# Patient Record
Sex: Female | Born: 1940 | Race: White | Hispanic: No | Smoking: Never smoker
Health system: Southern US, Community
[De-identification: ages and names within clinical notes are randomized; demographics above are authoritative.]

## PROBLEM LIST (undated history)

## (undated) DIAGNOSIS — I10 Essential (primary) hypertension: Secondary | ICD-10-CM

## (undated) DIAGNOSIS — E559 Vitamin D deficiency, unspecified: Secondary | ICD-10-CM

## (undated) DIAGNOSIS — I69319 Unspecified symptoms and signs involving cognitive functions following cerebral infarction: Secondary | ICD-10-CM

## (undated) DIAGNOSIS — M1990 Unspecified osteoarthritis, unspecified site: Secondary | ICD-10-CM

## (undated) DIAGNOSIS — Z86711 Personal history of pulmonary embolism: Secondary | ICD-10-CM

## (undated) DIAGNOSIS — M8580 Other specified disorders of bone density and structure, unspecified site: Secondary | ICD-10-CM

## (undated) DIAGNOSIS — F0390 Unspecified dementia without behavioral disturbance: Secondary | ICD-10-CM

## (undated) DIAGNOSIS — E785 Hyperlipidemia, unspecified: Secondary | ICD-10-CM

## (undated) DIAGNOSIS — Z1231 Encounter for screening mammogram for malignant neoplasm of breast: Secondary | ICD-10-CM

## (undated) HISTORY — DX: Unspecified osteoarthritis, unspecified site: M19.90

## (undated) HISTORY — DX: Personal history of pulmonary embolism: Z86.711

## (undated) HISTORY — DX: Hyperlipidemia, unspecified: E78.5

## (undated) HISTORY — PX: TOTAL HIP ARTHROPLASTY: SHX124

## (undated) HISTORY — DX: Unspecified symptoms and signs involving cognitive functions following cerebral infarction: I69.319

## (undated) HISTORY — PX: APPENDECTOMY: SHX54

## (undated) HISTORY — DX: Other specified disorders of bone density and structure, unspecified site: M85.80

## (undated) HISTORY — PX: DILATION AND CURETTAGE OF UTERUS: SHX78

## (undated) HISTORY — DX: Vitamin D deficiency, unspecified: E55.9

## (undated) HISTORY — DX: Unspecified dementia, unspecified severity, without behavioral disturbance, psychotic disturbance, mood disturbance, and anxiety: F03.90

## (undated) HISTORY — PX: TUBAL LIGATION: SHX77

## (undated) HISTORY — PX: FEMORAL HERNIA REPAIR: SUR1179

## (undated) HISTORY — DX: Essential (primary) hypertension: I10

## (undated) HISTORY — PX: LUMBAR FUSION: SHX111

---

## 2007-11-05 LAB — LIPID PANEL
CHOL/HDL Ratio: 3.2 (ref 0–5.0)
Cholesterol, total: 181 MG/DL (ref 0–200)
HDL Cholesterol: 57 MG/DL (ref 40–60)
LDL, calculated: 105 MG/DL — ABNORMAL HIGH (ref 0–100)
Triglyceride: 95 MG/DL (ref 0–150)
VLDL, calculated: 19 MG/DL

## 2007-11-05 LAB — ALT: ALT (SGPT): 27 U/L — ABNORMAL LOW (ref 30–65)

## 2008-06-10 LAB — CBC WITH AUTOMATED DIFF
ABS. EOSINOPHILS: 0.1 10*3/uL (ref 0.0–0.4)
ABS. LYMPHOCYTES: 1.1 10*3/uL (ref 0.8–3.5)
ABS. MONOCYTES: 0.4 10*3/uL (ref 0–1.0)
ABS. NEUTROPHILS: 3.4 10*3/uL (ref 1.8–8.0)
BASOPHILS: 0 % (ref 0–3)
EOSINOPHILS: 2 % (ref 0–5)
HCT: 37.3 % (ref 36.0–46.0)
HGB: 13.4 g/dL (ref 12.0–16.0)
LYMPHOCYTES: 22 % (ref 20–51)
MCH: 33 PG (ref 25.0–35.0)
MCHC: 35.9 g/dL (ref 31.0–37.0)
MCV: 92 FL (ref 78.0–102.0)
MONOCYTES: 8 % (ref 2–9)
MPV: 8.3 FL (ref 7.4–10.4)
NEUTROPHILS: 68 % (ref 42–75)
PLATELET: 192 10*3/uL (ref 130–400)
RBC: 4.05 M/uL — ABNORMAL LOW (ref 4.10–5.10)
RDW: 13.4 % (ref 11.5–14.5)
WBC: 5 10*3/uL (ref 4.5–13.0)

## 2008-06-10 LAB — LIPID PANEL
CHOL/HDL Ratio: 3.3 (ref 0–5.0)
Cholesterol, total: 178 MG/DL (ref 0–200)
HDL Cholesterol: 54 MG/DL (ref 40–60)
LDL, calculated: 104 MG/DL — ABNORMAL HIGH (ref 0–100)
Triglyceride: 100 MG/DL (ref 0–150)
VLDL, calculated: 20 MG/DL

## 2008-06-10 LAB — METABOLIC PANEL, COMPREHENSIVE
A-G Ratio: 1.2 (ref 0.8–1.7)
ALT (SGPT): 31 U/L (ref 30–65)
AST (SGOT): 17 U/L (ref 15–37)
Albumin: 3.6 g/dL (ref 3.4–5.0)
Alk. phosphatase: 102 U/L (ref 50–136)
Anion gap: 7 mmol/L (ref 5–15)
BUN/Creatinine ratio: 17 (ref 12–20)
BUN: 15 MG/DL (ref 7–18)
Bilirubin, total: 0.4 MG/DL (ref 0.1–0.9)
CO2: 33 MMOL/L — ABNORMAL HIGH (ref 21–32)
Calcium: 9.3 MG/DL (ref 8.4–10.4)
Chloride: 106 MMOL/L (ref 100–108)
Creatinine: 0.9 mg/dL (ref 0.6–1.3)
GFR est AA: >60 mL/min/{1.73_m2} (ref >60)
GFR est non-AA: >60 mL/min/{1.73_m2} (ref >60)
Globulin: 3.1 g/dL (ref 2.0–4.0)
Glucose: 94 MG/DL (ref 74–99)
Potassium: 4.6 MMOL/L (ref 3.5–5.5)
Protein, total: 6.7 g/dL (ref 6.4–8.2)
Sodium: 146 MMOL/L — ABNORMAL HIGH (ref 136–145)

## 2008-06-10 LAB — TSH 3RD GENERATION: TSH: 1.66 u[IU]/mL (ref 0.51–6.27)

## 2008-06-12 LAB — VITAMIN D, 25 HYDROXY: Vitamin D 25-Hydroxy: 36 ng/mL (ref 30–80)

## 2013-08-24 LAB — MAGNESIUM: Magnesium: 2.2 mg/dL (ref 1.8–2.4)

## 2013-08-24 LAB — METABOLIC PANEL, BASIC
Anion gap: 11 mmol/L (ref 3.0–18)
BUN/Creatinine ratio: 18 (ref 12–20)
BUN: 18 MG/DL (ref 7.0–18)
CO2: 25 mmol/L (ref 21–32)
Calcium: 9.1 MG/DL (ref 8.5–10.1)
Chloride: 101 mmol/L (ref 100–108)
Creatinine: 0.99 MG/DL (ref 0.6–1.3)
GFR est AA: >60 mL/min/{1.73_m2} (ref >60)
GFR est non-AA: 58 mL/min/{1.73_m2} — ABNORMAL LOW (ref >60)
Glucose: 88 mg/dL (ref 74–99)
Potassium: 3.3 mmol/L — ABNORMAL LOW (ref 3.5–5.5)
Sodium: 137 mmol/L (ref 136–145)

## 2013-08-24 LAB — CBC WITH AUTOMATED DIFF
ABS. BASOPHILS: 0 10*3/uL (ref 0.0–0.1)
ABS. EOSINOPHILS: 0.1 10*3/uL (ref 0.0–0.4)
ABS. LYMPHOCYTES: 0.5 10*3/uL — ABNORMAL LOW (ref 0.9–3.6)
ABS. MONOCYTES: 0.5 10*3/uL (ref 0.05–1.2)
ABS. NEUTROPHILS: 9.2 10*3/uL — ABNORMAL HIGH (ref 1.8–8.0)
BASOPHILS: 0 % (ref 0–2)
EOSINOPHILS: 1 % (ref 0–5)
HCT: 40.4 % (ref 35.0–45.0)
HGB: 13.9 g/dL (ref 12.0–16.0)
LYMPHOCYTES: 5 % — ABNORMAL LOW (ref 21–52)
MCH: 31.8 PG (ref 24.0–34.0)
MCHC: 34.4 g/dL (ref 31.0–37.0)
MCV: 92.4 FL (ref 74.0–97.0)
MONOCYTES: 5 % (ref 3–10)
MPV: 10 FL (ref 9.2–11.8)
NEUTROPHILS: 89 % — ABNORMAL HIGH (ref 40–73)
PLATELET: 230 10*3/uL (ref 135–420)
RBC: 4.37 M/uL (ref 4.20–5.30)
RDW: 13.2 % (ref 11.6–14.5)
WBC: 10.3 10*3/uL (ref 4.6–13.2)

## 2013-08-24 LAB — URINALYSIS W/ RFLX MICROSCOPIC
Blood: NEGATIVE
Glucose: NEGATIVE mg/dL
Ketone: >80 mg/dL — AB
Leukocyte Esterase: NEGATIVE
Nitrites: NEGATIVE
Specific gravity: 1.02 (ref 1.003–1.030)
Urobilinogen: 0.2 EU/dL (ref 0.2–1.0)
pH (UA): 6 (ref 5.0–8.0)

## 2013-08-24 LAB — CARDIAC PANEL,(CK, CKMB & TROPONIN)
CK - MB: <0.5 ng/ml — ABNORMAL LOW (ref 0.5–3.6)
CK: 34 U/L (ref 26–192)
Troponin-I, QT: <0.02 NG/ML (ref 0.0–0.045)

## 2013-08-24 LAB — URINE MICROSCOPIC ONLY

## 2013-08-24 LAB — HEPATIC FUNCTION PANEL
A-G Ratio: 0.8 (ref 0.8–1.7)
ALT (SGPT): 21 U/L (ref 13–56)
AST (SGOT): 13 U/L — ABNORMAL LOW (ref 15–37)
Albumin: 3.1 g/dL — ABNORMAL LOW (ref 3.4–5.0)
Alk. phosphatase: 100 U/L (ref 45–117)
Bilirubin, direct: 0.3 MG/DL — ABNORMAL HIGH (ref 0.0–0.2)
Bilirubin, total: 1 MG/DL (ref 0.2–1.0)
Globulin: 3.9 g/dL (ref 2.0–4.0)
Protein, total: 7 g/dL (ref 6.4–8.2)

## 2013-08-24 LAB — PROTHROMBIN TIME + INR
INR: 1 (ref 0.8–1.2)
Prothrombin time: 13.1 s (ref 11.5–15.2)

## 2013-08-24 LAB — PTT: aPTT: 37.9 s — ABNORMAL HIGH (ref 24.6–37.7)

## 2013-08-24 LAB — LIPASE: Lipase: 160 U/L (ref 73–393)

## 2013-08-24 MED ORDER — IOPAMIDOL 61 % IV SOLN
300 mg iodine /mL (61 %) | Freq: Once | INTRAVENOUS | Status: AC
Start: 2013-08-24 — End: 2013-08-24
  Administered 2013-08-24: 20:00:00 via INTRAVENOUS

## 2013-08-24 MED ORDER — CIPROFLOXACIN 500 MG TAB
500 mg | ORAL_TABLET | Freq: Two times a day (BID) | ORAL | Status: AC
Start: 2013-08-24 — End: 2013-09-03

## 2013-08-24 MED ORDER — ONDANSETRON (PF) 4 MG/2 ML INJECTION
4 mg/2 mL | INTRAMUSCULAR | Status: AC
Start: 2013-08-24 — End: 2013-08-24
  Administered 2013-08-24: 19:00:00 via INTRAVENOUS

## 2013-08-24 MED ORDER — METRONIDAZOLE 500 MG TAB
500 mg | ORAL_TABLET | Freq: Three times a day (TID) | ORAL | Status: AC
Start: 2013-08-24 — End: 2013-09-03

## 2013-08-24 MED ORDER — OXYCODONE-ACETAMINOPHEN 5 MG-325 MG TAB
5-325 mg | ORAL_TABLET | ORAL | Status: DC
Start: 2013-08-24 — End: 2013-09-06

## 2013-08-24 MED ORDER — ONDANSETRON 4 MG TAB, RAPID DISSOLVE
4 mg | ORAL_TABLET | ORAL | Status: DC
Start: 2013-08-24 — End: 2013-09-06

## 2013-08-24 MED ORDER — POTASSIUM CHLORIDE SR 20 MEQ TAB, PARTICLES/CRYSTALS
20 mEq | ORAL | Status: DC
Start: 2013-08-24 — End: 2013-08-24

## 2013-08-24 MED ADMIN — sodium chloride 0.9 % bolus infusion 1,000 mL: INTRAVENOUS | @ 19:00:00 | NDC 00409798309

## 2013-08-24 MED FILL — POTASSIUM CHLORIDE SR 20 MEQ TAB, PARTICLES/CRYSTALS: 20 mEq | ORAL | Qty: 2

## 2013-08-24 MED FILL — ISOVUE-300  61 % INTRAVENOUS SOLUTION: 300 mg iodine /mL (61 %) | INTRAVENOUS | Qty: 100

## 2013-08-24 MED FILL — ONDANSETRON HCL 4 MG/2 ML INTRAVENOUS SYRINGE: 4 mg/2 mL | INTRAVENOUS | Qty: 2

## 2013-08-24 MED FILL — SODIUM CHLORIDE 0.9 % IV: INTRAVENOUS | Qty: 1000

## 2013-08-24 NOTE — ED Notes (Signed)
Pt given discharge instructions and prescriptions. Armband removed. Pt left c family via private vehicle.

## 2013-08-24 NOTE — ED Provider Notes (Signed)
HPI Comments: Judith Jenkins is a 73 y.o. Female with Hx of HTN, presents to ED with c/o N/V and abdominal pain x 4 days.  She says pain diffuse lower abdomen, comes and goes, cramp like.  Diarrhea liquid, has decreased d/t not eating much, and pt says there is bright red blood in stool.  She has never had this before, has Hx of hemorrhoids, no anal pain.  She says sometimes when he vomits she notices some bright red blood in vomit.  She says she has forceful vomiting.  She has had normal colonoscopies last unknown but says she goes every 10 years.  She denies any fever, chills, Cp, SOb, urinary symptoms, leg swelling.  She has had her appendix removed.  No other complaints.       Patient is a 73 y.o. female presenting with nausea and vomiting.   Nausea     Vomiting          No past medical history on file.     No past surgical history on file.      No family history on file.     History     Social History   ??? Marital Status: SINGLE     Spouse Name: N/A     Number of Children: N/A   ??? Years of Education: N/A     Occupational History   ??? Not on file.     Social History Main Topics   ??? Smoking status: Not on file   ??? Smokeless tobacco: Not on file   ??? Alcohol Use: Not on file   ??? Drug Use: Not on file   ??? Sexual Activity: Not on file     Other Topics Concern   ??? Not on file     Social History Narrative   ??? No narrative on file                  ALLERGIES: Sulfa (sulfonamide antibiotics)      Review of Systems   Gastrointestinal: Positive for nausea and vomiting.   Constitutional:  Denies  fever, chills.   Head:  Denies injury.   Face:  Denies injury or pain.   ENMT:  Denies sore throat, trouble swallowing, runny nose, congestion   Neck:  Denies injury or pain.   Chest:  Denies injury.   Cardiac:  Denies chest pain or palpitations.   Respiratory:  Denies cough, wheezing, difficulty breathing, shortness of breath.   GI/ABD:  + pain, nausea, vomiting, diarrhea.   GU:  Denies injury, pain, dysuria, urgency, frequency    Back:  Denies injury or pain.   Extremity/MS:  Denies injury or pain.    Neuro:  Denies headache, LOC, dizziness, neurologic symptoms/deficits/paresthesias/MW  Skin: Denies injury, rash, itching or skin changes.      Filed Vitals:    08/24/13 1420   BP: 167/88   Pulse: 90   Temp: 98.2 ??F (36.8 ??C)   Resp: 16   Height: $Remove'5\' 1"'twhwbPq$  (1.549 m)   Weight: 58.968 kg (130 lb)   SpO2: 97%            Physical Exam   CONSTITUTIONAL: Alert, in no apparent distress; well-developed; well-nourished.   HEAD:  Normocephalic, atraumatic.   EYES:   EOM's intact, conjunctiva normal   ENTM: Nose:  No rhinorrhea.  External ears and ear canals normal BL.  Mucous membranes moist, uvula midline, able to handle oral secretions without difficulty, posterior pharynx normal.  Neck:  supple   RESP: Chest clear, equal breath sounds.   CV: S1 and S2 WNL; No murmurs, gallops or rubs.   GI: Abdomen soft with diffuse TTP to lower abdomen.   No masses or organomegaly.   Rectal:  Hemorrhoid noted, not thrombosed, no apparent active bleeding, hemoccult +   UPPER EXT:  Normal inspection.   LOWER EXT: No edema.   NEURO: gait normal, tone normal, sensation intact   SKIN: No rashes; Normal for age and stage.   PSYCH:  Alert and oriented, normal affect.    MDM  Number of Diagnoses or Management Options  Diarrhea:   Diverticulitis of large intestine without perforation or abscess with bleeding:   Hematemesis:   Hematochezia:   Nausea with vomiting:   Diagnosis management comments: DDX: appendicitis, diverticulitis, renal colic, UTI, cholecystitis, biliary colic, constipation, IBS, GI bleed, PUD, perforated bowel, bowel obstruction, bowel ischemia, pancreatitis, hepatitis, inguinal hernia, incarcerated hernia, less likely cardiac etiology    Plan:  Labs reassuring.  EKG NSR with no specific ST abnormality.  H & H stable.    CT IMPRESSION:  Colonic diverticulosis with greatest extent in the sigmoid colon where there is   mesenteric stranding, wall thickening and collection of free fluid suggestive of  acute diverticulitis.  Cholelithiasis without evidence for acute cholecystitis.  Benign right renal cyst and other hypodensities in the kidneys which are too  small to accurately characterize, probably also benign renal cysts.  Nonobstructive right nephrolithiasis.  Atherosclerotic disease.    Pt able to tolerate PO fluids, vitals stable, afebrile, no WBC.  Plan to DC with cipro/flagyl, zofran, few pain meds.  Rest, diet precautions explained.  Pt has Hx of diverticulitis.    5:11 PM  I HAVE REVIEWED THIS CASE WITH THE ED ATTENDING, DR. Veneta Penton , INCLUDING THE HISTORY, PHYSICAL EXAM FINDINGS AND AVAILABLE DIAGNOSTIC RESULTS.  THE ATTENDING MD AGREES WITH PLAN OF CARE AT THIS TIME.          Procedures    -------------------------------------------------------------------------------------------------------------------  Orders:  Orders Placed This Encounter   ??? CT ABD PELV W CONT     IV only     Standing Status: Standing      Number of Occurrences: 1      Standing Expiration Date:      Order Specific Question:  Transport     Answer:  Biomedical scientist [[id number]]     Order Specific Question:  Reason for Exam     Answer:  ab pain, N/V, blood in vomit and stool     Order Specific Question:  Is Patient Allergic to Contrast Dye?     Answer:  No     Order Specific Question:  Type of Contrast     Answer:  IV     Order Specific Question:  Does patient have history of Renal Disease?     Answer:  No     Order Specific Question:  Oral Contrast     Answer:  Per Radiologist Protocol   ??? CBC WITH AUTOMATED DIFF     Standing Status: Standing      Number of Occurrences: 1      Standing Expiration Date:    ??? METABOLIC PANEL, BASIC     Standing Status: Standing      Number of Occurrences: 1      Standing Expiration Date:    ??? MAGNESIUM     Standing Status: Standing      Number of Occurrences: 1  Standing Expiration Date:    ??? CARDIAC PANEL,(CK, CKMB & TROPONIN)      Standing Status: Standing      Number of Occurrences: 1      Standing Expiration Date:    ??? HEPATIC FUNCTION PANEL     Standing Status: Standing      Number of Occurrences: 1      Standing Expiration Date:    ??? LIPASE     Standing Status: Standing      Number of Occurrences: 1      Standing Expiration Date:    ??? PROTHROMBIN TIME + INR     Standing Status: Standing      Number of Occurrences: 1      Standing Expiration Date:    ??? PTT     Standing Status: Standing      Number of Occurrences: 1      Standing Expiration Date:    ??? URINALYSIS W/ RFLX MICROSCOPIC     Standing Status: Standing      Number of Occurrences: 1      Standing Expiration Date:    ??? URINE MICROSCOPIC ONLY     Standing Status: Standing      Number of Occurrences: 1      Standing Expiration Date:    ??? POC FECAL OCCULT BLOOD     Standing Status: Standing      Number of Occurrences: 1      Standing Expiration Date:    ??? EKG, 12 LEAD, INITIAL     Standing Status: Standing      Number of Occurrences: 1      Standing Expiration Date:      Order Specific Question:  Reason for Exam:     Answer:  N/V   ??? INSERT PERIPHERAL IV ONE TIME STAT     Standing Status: Standing      Number of Occurrences: 1      Standing Expiration Date:    ??? sodium chloride 0.9 % bolus infusion 1,000 mL     Sig:    ??? ondansetron (ZOFRAN) injection 4 mg     Sig:    ??? iopamidol (ISOVUE 300) 61 % contrast injection 50-100 mL     Sig:    ??? potassium chloride (K-DUR, KLOR-CON) SR tablet 40 mEq     Sig:    ??? ciprofloxacin HCl (CIPRO) 500 mg tablet     Sig: Take 1 Tab by mouth two (2) times a day for 10 days.     Dispense:  20 Tab     Refill:  0   ??? metroNIDAZOLE (FLAGYL) 500 mg tablet     Sig: Take 1 Tab by mouth three (3) times daily for 10 days.     Dispense:  30 Tab     Refill:  0   ??? ondansetron (ZOFRAN ODT) 4 mg disintegrating tablet     Sig: Take 1-2 tablets every 6-8 hours as needed for nausea and vomiting.     Dispense:  10 Tab     Refill:  0    ??? oxyCODONE-acetaminophen (PERCOCET) 5-325 mg per tablet     Sig: Take 1 tablet every 4-6 hours as needed for pain control.  If you were instructed to try over the counter ibuprofen or tylenol, only take the percocet for pain not controlled with the over the counter medication.     Dispense:  9 Tab     Refill:  0  EKG Interpretation:      Lab Results:   Recent Results (from the past 12 hour(s))   CBC WITH AUTOMATED DIFF    Collection Time: 08/24/13  2:55 PM   Result Value Ref Range    WBC 10.3 4.6 - 13.2 K/uL    RBC 4.37 4.20 - 5.30 M/uL    HGB 13.9 12.0 - 16.0 g/dL    HCT 40.4 35.0 - 45.0 %    MCV 92.4 74.0 - 97.0 FL    MCH 31.8 24.0 - 34.0 PG    MCHC 34.4 31.0 - 37.0 g/dL    RDW 13.2 11.6 - 14.5 %    PLATELET 230 135 - 420 K/uL    MPV 10.0 9.2 - 11.8 FL    NEUTROPHILS 89 (H) 40 - 73 %    LYMPHOCYTES 5 (L) 21 - 52 %    MONOCYTES 5 3 - 10 %    EOSINOPHILS 1 0 - 5 %    BASOPHILS 0 0 - 2 %    ABS. NEUTROPHILS 9.2 (H) 1.8 - 8.0 K/UL    ABS. LYMPHOCYTES 0.5 (L) 0.9 - 3.6 K/UL    ABS. MONOCYTES 0.5 0.05 - 1.2 K/UL    ABS. EOSINOPHILS 0.1 0.0 - 0.4 K/UL    ABS. BASOPHILS 0.0 0.0 - 0.1 K/UL    DF AUTOMATED     METABOLIC PANEL, BASIC    Collection Time: 08/24/13  2:55 PM   Result Value Ref Range    Sodium 137 136 - 145 mmol/L    Potassium 3.3 (L) 3.5 - 5.5 mmol/L    Chloride 101 100 - 108 mmol/L    CO2 25 21 - 32 mmol/L    Anion gap 11 3.0 - 18 mmol/L    Glucose 88 74 - 99 mg/dL    BUN 18 7.0 - 18 MG/DL    Creatinine 0.99 0.6 - 1.3 MG/DL    BUN/Creatinine ratio 18 12 - 20      GFR est AA >60 >60 ml/min/1.30m    GFR est non-AA 58 (L) >60 ml/min/1.728m   Calcium 9.1 8.5 - 10.1 MG/DL   MAGNESIUM    Collection Time: 08/24/13  2:55 PM   Result Value Ref Range    Magnesium 2.2 1.8 - 2.4 mg/dL   CARDIAC PANEL,(CK, CKMB & TROPONIN)    Collection Time: 08/24/13  2:55 PM   Result Value Ref Range    CK 34 26 - 192 U/L    CK - MB <0.5 (L) 0.5 - 3.6 ng/ml    CK-MB Index CANNOT BE CALCULATED 0.0 - 4.0 %     Troponin-I, Qt. <0.02 0.0 - 0.045 NG/ML   HEPATIC FUNCTION PANEL    Collection Time: 08/24/13  2:55 PM   Result Value Ref Range    Protein, total 7.0 6.4 - 8.2 g/dL    Albumin 3.1 (L) 3.4 - 5.0 g/dL    Globulin 3.9 2.0 - 4.0 g/dL    A-G Ratio 0.8 0.8 - 1.7      Bilirubin, total 1.0 0.2 - 1.0 MG/DL    Bilirubin, direct 0.3 (H) 0.0 - 0.2 MG/DL    Alk. phosphatase 100 45 - 117 U/L    AST 13 (L) 15 - 37 U/L    ALT 21 13 - 56 U/L   LIPASE    Collection Time: 08/24/13  2:55 PM   Result Value Ref Range    Lipase 160 73 - 393 U/L  PROTHROMBIN TIME + INR    Collection Time: 08/24/13  2:55 PM   Result Value Ref Range    Prothrombin time 13.1 11.5 - 15.2 sec    INR 1.0 0.8 - 1.2     PTT    Collection Time: 08/24/13  2:55 PM   Result Value Ref Range    aPTT 37.9 (H) 24.6 - 37.7 SEC   URINALYSIS W/ RFLX MICROSCOPIC    Collection Time: 08/24/13  4:05 PM   Result Value Ref Range    Color YELLOW      Appearance CLEAR      Specific gravity 1.020 1.003 - 1.030      pH (UA) 6.0 5.0 - 8.0      Protein TRACE (A) NEG mg/dL    Glucose NEGATIVE  NEG mg/dL    Ketone >80 (A) NEG mg/dL    Bilirubin MODERATE (A) NEG      Blood NEGATIVE  NEG      Urobilinogen 0.2 0.2 - 1.0 EU/dL    Nitrites NEGATIVE  NEG      Leukocyte Esterase NEGATIVE  NEG     URINE MICROSCOPIC ONLY    Collection Time: 08/24/13  4:05 PM   Result Value Ref Range    WBC NONE 0 - 4 /hpf    RBC NONE 0 - 5 /hpf    Epithelial cells 2+ 0 - 5 /lpf    Bacteria FEW (A) NEG /hpf       Radiology Results:  CT ABD PELV W CONT   Final Result          Consultations:      Progress Notes:  5:14 PM:  Daphane Shepherd, PA answered the patient's questions regarding treatment.      -------------------------------------------------------------------------------------------------------------------    PROVIDER ATTESTATION STATEMENT      Disposition:  Diagnosis:   1. Nausea with vomiting    2. Diarrhea    3. Diverticulitis of large intestine without perforation or abscess with bleeding     4. Hematemesis    5. Hematochezia          Disposition: Home    Follow-up Information     Follow up With Details Comments Contact Info    [facility] EMERGENCY DEPT  As needed, If symptoms worsen [place]    your family doctor  Schedule an appointment as soon as possible for a visit in 2 days      Ron Agee, MD Schedule an appointment as soon as possible for a visit in 3 days  [address]  [place] [address]  [phone]            Current Discharge Medication List      START taking these medications    Details   ciprofloxacin HCl (CIPRO) 500 mg tablet Take 1 Tab by mouth two (2) times a day for 10 days.  Qty: 20 Tab, Refills: 0      metroNIDAZOLE (FLAGYL) 500 mg tablet Take 1 Tab by mouth three (3) times daily for 10 days.  Qty: 30 Tab, Refills: 0      ondansetron (ZOFRAN ODT) 4 mg disintegrating tablet Take 1-2 tablets every 6-8 hours as needed for nausea and vomiting.  Qty: 10 Tab, Refills: 0      oxyCODONE-acetaminophen (PERCOCET) 5-325 mg per tablet Take 1 tablet every 4-6 hours as needed for pain control.  If you were instructed to try over the counter  ibuprofen or tylenol, only take the percocet for pain not controlled with the over the counter medication.  Qty: 9 Tab, Refills: 0

## 2013-08-24 NOTE — ED Notes (Signed)
Pt states nausea and vomiting x 4 days. "feels like i have the flu"

## 2013-08-26 LAB — EKG, 12 LEAD, INITIAL
Atrial Rate: 73 {beats}/min
Calculated P Axis: 78 degrees
Calculated R Axis: 0 degrees
Calculated T Axis: 11 degrees
Diagnosis: NORMAL
P-R Interval: 162 ms
Q-T Interval: 376 ms
QRS Duration: 84 ms
QTC Calculation (Bezet): 414 ms
Ventricular Rate: 73 {beats}/min

## 2013-09-04 ENCOUNTER — Inpatient Hospital Stay
Admit: 2013-09-04 | Discharge: 2013-09-06 | Disposition: A | Discharge status: Home / Self Care | Payer: MEDICARE | Source: Other Acute Inpatient Hospital | Attending: Internal Medicine | Admitting: Internal Medicine

## 2013-09-04 DIAGNOSIS — N179 Acute kidney failure, unspecified: Secondary | ICD-10-CM

## 2013-09-04 LAB — METABOLIC PANEL, COMPREHENSIVE
A-G Ratio: 0.9 (ref 0.8–1.7)
ALT (SGPT): 19 U/L (ref 13–56)
AST (SGOT): 15 U/L (ref 15–37)
Albumin: 3.5 g/dL (ref 3.4–5.0)
Alk. phosphatase: 79 U/L (ref 45–117)
Anion gap: 13 mmol/L (ref 3.0–18)
BUN/Creatinine ratio: 10 — ABNORMAL LOW (ref 12–20)
BUN: 27 MG/DL — ABNORMAL HIGH (ref 7.0–18)
Bilirubin, total: 0.6 MG/DL (ref 0.2–1.0)
CO2: 26 mmol/L (ref 21–32)
Calcium: 9.6 MG/DL (ref 8.5–10.1)
Chloride: 100 mmol/L (ref 100–108)
Creatinine: 2.59 MG/DL — ABNORMAL HIGH (ref 0.6–1.3)
GFR est AA: 23 mL/min/{1.73_m2} — ABNORMAL LOW (ref >60)
GFR est non-AA: 19 mL/min/{1.73_m2} — ABNORMAL LOW (ref >60)
Globulin: 3.8 g/dL (ref 2.0–4.0)
Glucose: 110 mg/dL — ABNORMAL HIGH (ref 74–99)
Potassium: 2.7 mmol/L — CL (ref 3.5–5.5)
Protein, total: 7.3 g/dL (ref 6.4–8.2)
Sodium: 139 mmol/L (ref 136–145)

## 2013-09-04 LAB — LACTIC ACID
Lactic acid: 2.2 MMOL/L — ABNORMAL HIGH (ref 0.4–2.0)
Lactic acid: 1.1 MMOL/L (ref 0.4–2.0)

## 2013-09-04 LAB — CBC WITH AUTOMATED DIFF
ABS. BASOPHILS: 0.1 10*3/uL (ref 0.0–0.1)
ABS. EOSINOPHILS: 0.1 10*3/uL (ref 0.0–0.4)
ABS. LYMPHOCYTES: 1.7 10*3/uL (ref 0.9–3.6)
ABS. MONOCYTES: 0.9 10*3/uL (ref 0.05–1.2)
ABS. NEUTROPHILS: 9.4 10*3/uL — ABNORMAL HIGH (ref 1.8–8.0)
BASOPHILS: 0 % (ref 0–2)
EOSINOPHILS: 1 % (ref 0–5)
HCT: 40.7 % (ref 35.0–45.0)
HGB: 14.3 g/dL (ref 12.0–16.0)
LYMPHOCYTES: 14 % — ABNORMAL LOW (ref 21–52)
MCH: 31.6 PG (ref 24.0–34.0)
MCHC: 35.1 g/dL (ref 31.0–37.0)
MCV: 89.8 FL (ref 74.0–97.0)
MONOCYTES: 8 % (ref 3–10)
MPV: 10.7 FL (ref 9.2–11.8)
NEUTROPHILS: 77 % — ABNORMAL HIGH (ref 40–73)
PLATELET: 305 10*3/uL (ref 135–420)
RBC: 4.53 M/uL (ref 4.20–5.30)
RDW: 13.8 % (ref 11.6–14.5)
WBC: 12.2 10*3/uL (ref 4.6–13.2)

## 2013-09-04 LAB — EKG, 12 LEAD, INITIAL
Atrial Rate: 73 {beats}/min
Calculated P Axis: 71 degrees
Calculated R Axis: 5 degrees
Calculated T Axis: 23 degrees
Diagnosis: NORMAL
P-R Interval: 166 ms
Q-T Interval: 408 ms
QRS Duration: 94 ms
QTC Calculation (Bezet): 449 ms
Ventricular Rate: 73 {beats}/min

## 2013-09-04 LAB — CARDIAC PANEL,(CK, CKMB & TROPONIN)
CK - MB: <0.5 ng/ml — ABNORMAL LOW (ref 0.5–3.6)
CK: 27 U/L (ref 26–192)
Troponin-I, QT: <0.02 NG/ML (ref 0.0–0.045)

## 2013-09-04 LAB — MAGNESIUM: Magnesium: 2.2 mg/dL (ref 1.8–2.4)

## 2013-09-04 LAB — C. DIFFICILE/EPI PCR

## 2013-09-04 MED ORDER — DIATRIZOATE MEGLUMINE & SODIUM 66 %-10 % ORAL SOLN
66-10 % | Freq: Once | ORAL | Status: AC
Start: 2013-09-04 — End: 2013-09-04
  Administered 2013-09-04: 17:00:00 via ORAL

## 2013-09-04 MED ORDER — ONDANSETRON (PF) 4 MG/2 ML INJECTION
4 mg/2 mL | INTRAMUSCULAR | Status: AC
Start: 2013-09-04 — End: 2013-09-04
  Administered 2013-09-04: 15:00:00 via INTRAVENOUS

## 2013-09-04 MED ORDER — METRONIDAZOLE IN SODIUM CHLORIDE (ISO-OSM) 500 MG/100 ML IV PIGGY BACK
500 mg/100 mL | INTRAVENOUS | Status: AC
Start: 2013-09-04 — End: 2013-09-04
  Administered 2013-09-04: 22:00:00 via INTRAVENOUS

## 2013-09-04 MED ORDER — ONDANSETRON (PF) 4 MG/2 ML INJECTION
4 mg/2 mL | INTRAMUSCULAR | Status: AC
Start: 2013-09-04 — End: 2013-09-04
  Administered 2013-09-04: 16:00:00 via INTRAVENOUS

## 2013-09-04 MED ORDER — POTASSIUM CHLORIDE SR 20 MEQ TAB, PARTICLES/CRYSTALS
20 mEq | ORAL | Status: AC
Start: 2013-09-04 — End: 2013-09-04
  Administered 2013-09-04: 19:00:00 via ORAL

## 2013-09-04 MED ADMIN — sodium chloride 0.9 % bolus infusion 1,000 mL: INTRAVENOUS | @ 16:00:00 | NDC 00409798309

## 2013-09-04 MED ADMIN — potassium chloride 10 mEq in 100 ml IVPB: INTRAVENOUS | @ 22:00:00 | NDC 00338070948

## 2013-09-04 MED ADMIN — potassium chloride 10 mEq and lidocaine 10 mg in 100 ml NS IVPB: INTRAVENOUS | @ 16:00:00 | NDC 63323096510

## 2013-09-04 MED ADMIN — sodium chloride 0.9 % bolus infusion 1,000 mL: INTRAVENOUS | @ 15:00:00 | NDC 00409798309

## 2013-09-04 MED ADMIN — 0.9% sodium chloride infusion: INTRAVENOUS | @ 22:00:00 | NDC 00409798309

## 2013-09-04 MED FILL — POTASSIUM CHLORIDE 2 MEQ/ML IV SOLN: 2 mEq/mL | INTRAVENOUS | Qty: 5

## 2013-09-04 MED FILL — SODIUM CHLORIDE 0.9 % IV: INTRAVENOUS | Qty: 1000

## 2013-09-04 MED FILL — METRONIDAZOLE IN SODIUM CHLORIDE (ISO-OSM) 500 MG/100 ML IV PIGGY BACK: 500 mg/100 mL | INTRAVENOUS | Qty: 100

## 2013-09-04 MED FILL — POTASSIUM CHLORIDE 10 MEQ/100 ML IV PIGGY BACK: 10 mEq/0 mL | INTRAVENOUS | Qty: 100

## 2013-09-04 MED FILL — VANCOMYCIN ORAL SOLUTION 50 MG/ML CPD (RX COMPOUNDED): 50 mg/mL | ORAL | Qty: 5

## 2013-09-04 MED FILL — POTASSIUM CHLORIDE SR 20 MEQ TAB, PARTICLES/CRYSTALS: 20 mEq | ORAL | Qty: 2

## 2013-09-04 MED FILL — MD-GASTROVIEW 66 %-10 % ORAL SOLUTION: 66-10 % | ORAL | Qty: 30

## 2013-09-04 MED FILL — ONDANSETRON HCL 4 MG/2 ML INTRAVENOUS SYRINGE: 4 mg/2 mL | INTRAVENOUS | Qty: 2

## 2013-09-04 NOTE — Consults (Signed)
[facility]                               [address] [place], [place] [address]                                CONSULTATION REPORT    PATIENT:    Judith Jenkins, Judith Jenkins  MRN:            [id number]    ADMIT DATE:   09/04/2013  BILLING:        [id number] CONSULTED:    09/04/2013  ROOM:       [id number]  ATTENDING:  Cottie Banda, MD  DICTATING:  Caroleen Hamman, MD        REASON FOR CONSULTATION: Advise opinion regarding abdominal pain,  diarrhea.    HISTORY OF PRESENT ILLNESS: The patient is a 73 year old female who came  here on the 22nd of last month, was diagnosed with diverticulitis based on  CAT scan and symptoms, was treated with oral antibiotics. Later on started  having diarrhea, loose watery stool multiple times, weakness, fatigue. In  the last 4 days has not been able to eat, has nausea, dry heaves and her  appetite is poor. She says her abdomen is mildly sore, does not have that  much pain. Has weakness and is unable to ambulate.    PAST MEDICAL HISTORY: Generally unremarkable.    FAMILY HISTORY/SOCIAL HISTORY: The patient does not smoke or drink. No  illicit drug use. Single. No GI malignancy in the family.    ALLERGIES: SULFA.    REVIEW OF SYSTEMS  No chest pain, PND, orthopnea, polydipsia, polyuria, icterus, rash,  itching, odynophagia, dysphagia. Does have nausea, vomiting, abdominal  pain, and diarrhea. No bleeding tingling, numbness, poor appetite.    PHYSICAL EXAMINATION  GENERAL: The patient is a fairly built, fairly nourished, alert and  pleasant woman who is in no distress.  VITAL SIGNS: Stable.  HEENT: Head normocephalic. Eyes, pallor is seen. Bitemporal muscle wasting  noted. Mouth mucous membranes are dry, but pink.  NECK: Supple. No adenopathy. No JVD, no bruit.  LUNGS: Clear to auscultation. Good bilateral air entry.  HEART: Both sounds normal. Rhythm was regular.   ABDOMEN: Soft, minimal tenderness. No rebound, guarding, rigidity.  Decreased bowel sounds.  EXTREMITIES: Wasting was remarkable.  NEUROLOGIC: Communicates well. No mood swings.    LABORATORY DATA: CT of the abdomen was reviewed from the 22nd. Hemoglobin  and hematocrit is normal, WBC count of 12.2, creatinine is 2.25, potassium  is 2.7. C difficile assay is pending.    ASSESSMENT AND PLAN  1. A 73 year old female has generalized weakness, fatigue and poor appetite  with diarrhea and had an episode of diverticulitis, has abdominal pain. I  feel she might have C difficile colitis. Hypokalemia induced myopathy  cannot be entirely ruled out given the low potassium and weakness.  Potassium should be supplemented more aggressively than just by mouth.  2. Renal insufficiency related to above.  3. Abdominal pain.                     Caroleen Hamman, MD    ASD:wmx  D: 09/04/2013 T: 09/04/2013 04:36 P  Job: [id number]  CScriptDoc #: [id number]  cc:   Caroleen Hamman, MD        Cottie Banda, MD

## 2013-09-04 NOTE — Progress Notes (Addendum)
Informed Dr. Garret Reddish re: pt could not tolerate potassium PO , MD states pt will complete 3 bags of KCL with lidocaine.

## 2013-09-04 NOTE — Other (Signed)
TRANSFER - OUT REPORT:    Verbal report given to Octavia RN(name) on Judith Jenkins  being transferred to 455(unit) for routine progression of care       Report consisted of patient???s Situation, Background, Assessment and   Recommendations(SBAR).     Information from the following report(s) SBAR, Intake/Output, MAR and Recent Results was reviewed with the receiving nurse.    Lines:   Peripheral IV 09/04/13 Right Antecubital (Active)   Site Assessment Clean, dry, & intact 09/04/2013 10:21 AM   Phlebitis Assessment 0 09/04/2013 10:21 AM   Infiltration Assessment 0 09/04/2013 10:21 AM   Dressing Status Clean, dry, & intact 09/04/2013 10:21 AM   Dressing Type Transparent;Tape 09/04/2013 10:21 AM   Hub Color/Line Status Pink;Flushed;Patent 09/04/2013 10:21 AM   Action Taken Blood drawn 09/04/2013 10:21 AM        Opportunity for questions and clarification was provided.      Patient transported with:   Transportation

## 2013-09-04 NOTE — H&P (Signed)
[facility]                               [address] [place], [place] [address]                               HISTORY AND PHYSICAL    PATIENT:      Judith Jenkins, Judith Jenkins  MRN:             [id number]      DATE:   09/04/2013  BILLING:         [id number]   DOB:    Jul 16, 1940  ROOM:         [id number]  REFERRING:    Wyn Forster, MD  DICTATING:    Cottie Banda, MD        HISTORY OF PRESENT ILLNESS:  The patient is a 73 year old woman with a  history of remote appendectomy.  She said she began to have diarrhea and  abdominal cramping and pain, with nausea.  These symptoms started a couple  of weeks ago.  She was seen in the ER on 08/24/2013 and diagnosed with  likely sigmoid diverticulitis on the basis of exam and CT.  She was started  on Cipro and Flagyl.  She has not yet finished those.  She returned to the  ED, however, due to persistent abdominal pain, which she said is sort of in  the epigastrium and also in the lower abdomen, with some diffuse what she  describes as crampy pain.  She has also had continued diarrhea, nausea,  vomiting, and mainly dry heaves.  She has not really tolerated much of  anything to eat over the past week.  She denies fever, chills,  hematochezia, cough, dyspnea, chest pain, dysuria, hematuria, urinary  frequency, syncope, or headache.  She was seen in the emergency room and  was noted by labs to have an acute kidney injury pattern with an elevated BUN and  creatinine and a low potassium.  She was felt to have dehydration.  The ER  Doctor considered a CT, but consulted with Caroleen Hamman, M.D., GI, who felt  that the patient could have C. difficile and ordered p.o. vancomycin and IV  Flagyl for possible C. difficile diarrhea.  An abdominal x-ray was  basically a nonobstructive pattern, and no infiltrate was seen.  The  patient received some oral potassium.  IV potassium was ordered, but due to some   burning, it has not yet been completed.    PAST MEDICAL HISTORY  She has a pretty benign past medical history:  1. History of hypertension.  2. History of hypercholesterolemia.    ALLERGIES:  TO SULFA.    SOCIAL HISTORY:  Nonsmoker.  No ETOH.    MEDICATIONS  Her outpatient medications were:  1. Cipro 500 mg b.i.d.  2. Flagyl 500 t.i.d.  3. Zofran 4 mg every 8 hours.  4. Percocet p.r.n. pain.  5. Zocor 40 mg p.m.  6. Lisinopril 20 mg daily.  7. Hydrochlorothiazide 25 mg daily.    REVIEW OF SYSTEMS:  Basically 10 point ROS was negative, except for the positives noted  above.    PHYSICAL EXAMINATION  GENERAL:  The patient  is a pleasant woman who appears very fatigued.  She  says her main complaint at this moment is severe fatigue.  VITAL SIGNS:  Initially, ER blood pressure was 112/63, pulse rate 85,  temperature 97.9, and respiratory rate 19.  CONSTITUTIONAL:  She is alert and oriented.  She is in no distress, but  says she is very fatigued.  HEENT:  Head is normocephalic and atraumatic.  NECK:  No JVD noted.  No adenopathy.  LUNGS:  Clear to auscultation.  HEART:  Regular rate and rhythm, with no murmur or gallop heard.  ABDOMEN:  Currently, she has mild diffuse tenderness, but there is no  rebound and no guarding.  No organomegaly noted.  BACK:  No tenderness.  No CVA tenderness.  EXTREMITIES:  No edema in the lower extremities.  Calves are nontender.  NEUROLOGIC:  Alert and oriented.  No cranial nerve deficit.  Normal muscle  tone.  SKIN:  Normal, without a rash noted.  PSYCHIATRIC:  Normal mood and affect.    DATABASE:  Recent C. difficile actually has just returned negative.  Lactic  acid was normal.  Magnesium was normal.  Cardiac enzymes were okay.  Sodium  was 139, potassium very low at 2.7, chloride 100, bicarbonate 26, glucose  110, BUN 26, creatinine 2.59 (this is up from before).  LFTs were all  normal.  Lactic acid was up just a tad at 2.2.  White count up slightly at   12.2, hemoglobin 14.3, hematocrit 40.7, platelet count 305.  Urinalysis is  pending.  An abdominal film showed no evidence of pneumoperitoneum and  nonobstructive bowel gas, with no infiltrate appreciated.    EKG revealed a normal sinus rhythm, with no significant change compared  with prior.    EMERGENCY ROOM COURSE:  In the ER, she received IV fluids.  Initially, the  ER was considering a CT, but after talking with GI, they held off on that.  She received 40 of potassium and then IV runs with lidocaine were ordered,  with Zofran injection.    IMPRESSION  1. This is a 73 year old woman with acute kidney injury, likely secondary  to severe dehydration, with weeks of diarrhea, abdominal cramping, and very  poor p.o. intake.  She has been started on IV fluids to rehydrate.  2. Nausea and vomiting:  Although she just had dry heaves recently.  Very  low p.o. intake.  We will also check a lipase as I do not see that result  3. Diarrhea:  Question C. difficile.  However, the initial study was  negative and is usually fairly accurate.  I am questioning whether this  could be due to another form of colitis or infectious diarrhea.  Although  she did receive Cipro and Flagyl for several days, I am going to go ahead  and do some additional cultures.  For the moment, I am going to discontinue  the oral vancomycin, but will leave her on Flagyl and Cipro adjusted to her  renal dysfunction, pending further studies.  4. Status post diverticulitis:  Question whether that has been incompletely  treated on oral.  If renal function improves, we may be able to go ahead  and obtain a CT, but have not ordered that just yet.  5. We will give the IV antibiotics as above.  6. Hypokalemia:  Because she had burning with the IV, we are going to try  oral.  If that is not satisfactory, then, we will give the IV.  She  probably had some hypokalemic myopathy with fatigue.    PLAN:  We will send stools as noted above.  Continue IV fluids, with   potassium replacement (oral potassium,and if needed IV as well).  GI can follow along with [place] for  further recommendations.  I am also going to go ahead and check an  ultrasound of the gallbladder to make sure that could not be  A source of some  of the nausea and discomfort.    Follow up labs in the morning.    For DVT  prophylaxis at the moment, given all the diarrhea and GI issues stated, I  am going to hold off on chemical pharmacological anticoagulation.  I am  going to place SCDs.    TIME SPENT:  Over 60 minutes were spent on the patient admission today.    ESTIMATED LENGTH OF STAY:  Anticipate a stay of two to four days.                     Cottie Banda, MD    DYD:wmx  D: 09/04/2013  08:07 P  T: 09/04/2013  09:05 P  Job: [id number]  CScriptDoc #: [id number]  cc:   Wyn Forster, MD        Cottie Banda, MD

## 2013-09-04 NOTE — ED Notes (Signed)
Pt completed contrast

## 2013-09-04 NOTE — ED Notes (Signed)
Pt has not vomited since being in ER. Pt held down oral contrast and has not complained of nausea at this time. VSS

## 2013-09-04 NOTE — ED Notes (Signed)
Per triage nurse pt had seizure like activity in triage. Pt is alert and oriented at this time

## 2013-09-04 NOTE — ED Provider Notes (Signed)
HPI Comments: Judith Jenkins is a 73 y.o. female with a Hx of remote appendectomy and sigmoid diverticulitis diagnosed on 08/24/13, returns to the ED due to persistent abd pain and N/V/D.  She notes that she is mostly having "dry heaves" now, hasn't really had anything to eat over the past week.  She notes that symptoms began with "a little of both vomiting and diarrhea" but says that nausea is currently her primary symptom. She is still passing gas but states she hasn't really had a bowel movement in several days "since I haven't really had anything to eat". She denies any frank abdominal pain, "just kind of sore all over".  She denies any fevers, chills, hematochezia, cough, dyspnea, chest pain, dysuria, hematuria, urinary frequency, lightheadedness, syncope or HA.  Pt made no further complaints.    The history is provided by the patient and medical records. No language interpreter was used.        No past medical history on file.     No past surgical history on file.      No family history on file.     History     Social History   ??? Marital Status: SINGLE     Spouse Name: N/A     Number of Children: N/A   ??? Years of Education: N/A     Occupational History   ??? Not on file.     Social History Main Topics   ??? Smoking status: Not on file   ??? Smokeless tobacco: Not on file   ??? Alcohol Use: Not on file   ??? Drug Use: Not on file   ??? Sexual Activity: Not on file     Other Topics Concern   ??? Not on file     Social History Narrative   ??? No narrative on file                  ALLERGIES: Sulfa (sulfonamide antibiotics)      Review of Systems   Constitutional: Negative.  Negative for fever and chills.   HENT: Negative.    Eyes: Negative.    Respiratory: Negative.  Negative for cough and shortness of breath.    Cardiovascular: Negative.  Negative for chest pain.   Gastrointestinal: Positive for nausea, vomiting, abdominal pain and diarrhea. Negative for blood in stool.   Endocrine: Negative.     Genitourinary: Negative.  Negative for dysuria, frequency and hematuria.   Musculoskeletal: Negative.    Skin: Negative.    Allergic/Immunologic: Negative.    Neurological: Negative.  Negative for light-headedness and headaches.   Hematological: Negative.    Psychiatric/Behavioral: Negative.    All other systems reviewed and are negative.      Filed Vitals:    09/04/13 1015 09/04/13 1016 09/04/13 1018   BP: 112/63     Pulse:  85    Temp:  97.9 ??F (36.6 ??C)    Resp:  19    SpO2:  100% 99%            Physical Exam   Constitutional: She is oriented to person, place, and time. She appears well-developed and well-nourished. No distress.   Resting comfortably on stretcher. Thin and somewhat frail appearing.   HENT:   Head: Normocephalic and atraumatic.   Mouth/Throat: Oropharynx is clear and moist.   MM tacky   Eyes: Conjunctivae and EOM are normal. No scleral icterus.   Sclera clear bilaterally   Neck: Normal range of motion. Neck  supple. No JVD present.   Non-tender to palpation   Cardiovascular: Normal rate, regular rhythm and normal heart sounds.  Exam reveals no gallop and no friction rub.    No murmur heard.  Pulmonary/Chest: Effort normal and breath sounds normal. No respiratory distress. She has no wheezes. She has no rales. She exhibits no tenderness.   No crepitance with palpation   Abdominal: Soft. She exhibits no distension and no mass. There is tenderness. There is no rebound and no guarding.   Mild diffuse tenderness, no evidence of peritonitis.  Quiet but present bowel sounds throughout.   Genitourinary:   No CVA tenderness   Musculoskeletal: She exhibits no edema or tenderness.   Normal inspection of upper extremities.  No edema noted to bilateral lower extremities   Lymphadenopathy:     She has no cervical adenopathy.   Neurological: She is alert and oriented to person, place, and time. No cranial nerve deficit. She exhibits normal muscle tone.    Normal motor and sensation bilaterally to upper and lower extremities   Skin: Skin is warm and dry. No rash noted. She is not diaphoretic.   Psychiatric:   Normal mood and affect.     Vitals reviewed.       MDM  Number of Diagnoses or Management Options  Diagnosis management comments: Pt with persistent nausea and vomiting, mild diffuse abdominal pain and diagnosis of diverticulitis on 8/22.  No acute distress, abdominal exam is unalarming.  Pt appears dehydrated.  Will check labs, XR abdomen for possible obstructive process, IVF and treat nausea.  Reassess.    Discussed with Dr. Luberta Mutter, will consult on pt.  Recommends PO vancomycin and IV flagyl for presumed C diff colitis.    Discussed with Dr. Remer Macho, will admit.    Discussed with Dr. Baldemar Friday, agrees with IV hydration and will consult on pt.    Pt in agreement with admission plans.       Amount and/or Complexity of Data Reviewed  Clinical lab tests: ordered and reviewed  Tests in the radiology section of CPT??: ordered and reviewed  Tests in the medicine section of CPT??: ordered and reviewed  Decide to obtain previous medical records or to obtain history from someone other than the patient: yes  Obtain history from someone other than the patient: yes  Discuss the patient with other providers: yes  Independent visualization of images, tracings, or specimens: yes    Risk of Complications, Morbidity, and/or Mortality  Presenting problems: moderate  Management options: moderate    Patient Progress  Patient progress: stable      Procedures    EKG:  NSR, rate 73, normal axis, no ST-T abnormalities.  No significant changes noted from 08/24/13.    XR:  No acute pulmonary process, non-obstructive bowel gas pattern    Scribe Attestation:   September 04, 2013 at 10:30 AM Aaron Edelman scribing for and in the presence of Dr.Farhaan Mabee Lenord Carbo, MD     Aaron Edelman, Scribe      I personally performed the services described in the documentation,  reviewed the documentation, as recorded by the scribe in my presence, and it accurately and completely records my words and actions.  Rudell Cobb, MD      (PROVIDER ATTESTATION)

## 2013-09-04 NOTE — Progress Notes (Addendum)
Pt was not able to tolerate liquid PO potassium and refused to take anymore PO medications, started KCL IV with lidocaine per Dr. Alyse Low order, she also refused metrodinazole IV medications due to burning sensation in her veins, Pt denies pain, nausea as of this time she states that it was just the PO potassium that makes her vomit when she tried to swallow it. Pt has no distress, family member at bedside, will continue to monitor.

## 2013-09-04 NOTE — ED Notes (Signed)
Nurse from [address] requested Transportation order be placed for patient to be brought up

## 2013-09-04 NOTE — ED Notes (Signed)
Pt reports virus last Saturday; last hs :nausea and vomiting reoccurred

## 2013-09-04 NOTE — ED Notes (Signed)
2 witness seizures : 1 in triage/ another taking pt to room 1

## 2013-09-04 NOTE — Other (Signed)
Bedside and Verbal shift change report given to [facility].RN (Cabin crew) by Robet Leu (offgoing nurse). Report included the following information SBAR, Kardex, MAR and Recent Results.    SITUATION:   ? Code Status: No Order  ? Reason for Admission: AKI (acute kidney injury) (HCC)    ? Hospital day: 0  ? Problem List:       Hospital Problems  Never Reviewed        Codes Class Noted POA    AKI (acute kidney injury) [facility]) ICD-9-CM:  584.9  ICD-10-CM:  N17.9  09/04/2013 Unknown              BACKGROUND:    Past Medical History: No past medical history on file.      Patient taking anticoagulants no     ASSESSMENT:   ? Changes in Assessment Throughout Shift: none    ? Patient has Central Line: no Reasons if yes:   ? Patient has Foley Cath: no Reasons if yes:      ? Last Vitals:     Filed Vitals:    09/04/13 1756   BP: 147/71   Pulse: 70   Temp: 97.9 ??F (36.6 ??C)   Resp: 18   SpO2: 98%       ? IV and DRAINS (will only show if present)   Peripheral IV 09/04/13 Left Antecubital-Site Assessment: Clean, dry, & intact  Peripheral IV 09/04/13 Right Antecubital-Site Assessment: Clean, dry, & intact    ? WOUND (if present)   Wound Type:  none   Dressing present     Wound Concerns/Notes:  none    ? PAIN    Pain Assessment    Pain Intensity 1: 0 (09/04/13 1016)              Patient Stated Pain Goal: 0  o Interventions for Pain:  n/a  o Intervention effective: n/a  o Time of last intervention: n/a   o Reassessment Completed: n/a     ? Last 3 Weights:  There were no vitals filed for this visit.  Weight change:     ? INTAKE/OUPUT    Current Shift:      Last three shifts:      ? LAB RESULTS     Recent Labs      09/04/13   1016   WBC  12.2   HGB  14.3   HCT  40.7   PLT  305        Recent Labs      09/04/13   1016   NA  139   K  2.7*   GLU  110*   BUN  27*   CREA  2.59*   CA  9.6   MG  2.2       RECOMMENDATIONS AND DISCHARGE PLANNING     1. Pending tests/procedures/ Plan of Care or Other Needs: none      2. Discharge plan for patient and Needs/Barriers: d/c home    3. Estimated Discharge Date: 09/08/13 Posted on Whiteboard in Patient???s Room: yes       "HEALS" SAFETY CHECK      Fall Risk         Safety Measures: Safety Measures: Bed/Chair-Wheels locked, Bed in low position, Call light within reach    A safety check occurred in the patient's room between off going nurse and oncoming nurse listed above.    The safety check included the below items  Area Items  H  High Alert Medications ? Verify all high alert medication drips (heparin, PCA, etc.)   E  Equipment ? Suction is set up for ALL patients (with yanker)  ? Red plugs utilized for all equipment (IV pumps, etc.)  ? WOW???s wiped down at end of shift.  ? Room stocked with oxygen, suction, and other unit-specific supplies   A  Alarms ? Bed alarm is set for fall risk patients  ? Ensure chair alarm is in place and activated if patient is up in a chair   L  Lines ? Check IV for any infiltration  ? Foley bag is empty if patient has a Foley   ? Tubing and IV bags are labeled   S  Safety   ? Room is clean, patient is clean, and equipment is clean.  ? Hallways are clear from equipment besides carts.   ? Fall bracelet on for fall risk patients  ? Ensure room is clear and free of clutter  ? Suction is set up for ALL patients (with yanker)  ? Hallways are clear from equipment besides carts.   ? Isolation precautions followed, supplies available outside room, sign posted     Robet Leu

## 2013-09-05 LAB — METABOLIC PANEL, BASIC
Anion gap: 10 mmol/L (ref 3.0–18)
BUN/Creatinine ratio: 11 — ABNORMAL LOW (ref 12–20)
BUN: 23 MG/DL — ABNORMAL HIGH (ref 7.0–18)
CO2: 22 mmol/L (ref 21–32)
Calcium: 8.4 MG/DL — ABNORMAL LOW (ref 8.5–10.1)
Chloride: 112 mmol/L — ABNORMAL HIGH (ref 100–108)
Creatinine: 2.08 MG/DL — ABNORMAL HIGH (ref 0.6–1.3)
GFR est AA: 30 mL/min/{1.73_m2} — ABNORMAL LOW (ref >60)
GFR est non-AA: 25 mL/min/{1.73_m2} — ABNORMAL LOW (ref >60)
Glucose: 90 mg/dL (ref 74–99)
Potassium: 3.9 mmol/L (ref 3.5–5.5)
Sodium: 144 mmol/L (ref 136–145)

## 2013-09-05 LAB — CBC W/O DIFF
HCT: 35 % (ref 35.0–45.0)
HGB: 12 g/dL (ref 12.0–16.0)
MCH: 31.3 PG (ref 24.0–34.0)
MCHC: 34.3 g/dL (ref 31.0–37.0)
MCV: 91.1 FL (ref 74.0–97.0)
MPV: 10.5 FL (ref 9.2–11.8)
PLATELET: 216 10*3/uL (ref 135–420)
RBC: 3.84 M/uL — ABNORMAL LOW (ref 4.20–5.30)
RDW: 14.1 % (ref 11.6–14.5)
WBC: 7.9 10*3/uL (ref 4.6–13.2)

## 2013-09-05 LAB — WBC, STOOL

## 2013-09-05 LAB — LIPASE: Lipase: 519 U/L — ABNORMAL HIGH (ref 73–393)

## 2013-09-05 MED ADMIN — potassium chloride 40 mEq in 0.9% sodium chloride IVPB: INTRAVENOUS | @ 02:00:00 | NDC 00409798309

## 2013-09-05 MED ADMIN — potassium chloride 10 mEq and lidocaine 10 mg in 100 ml NS IVPB: INTRAVENOUS | @ 05:00:00 | NDC 63323049227

## 2013-09-05 MED ADMIN — 0.9% sodium chloride with KCl 40 mEq/L infusion: INTRAVENOUS | @ 16:00:00 | NDC 00409711609

## 2013-09-05 MED ADMIN — Saccharomyces boulardii (FLORASTOR) capsule 500 mg: ORAL | @ 16:00:00 | NDC 66825000201

## 2013-09-05 MED ADMIN — famotidine (PF) (PEPCID) 20 mg in sodium chloride 0.9 % 10 mL injection: INTRAVENOUS | @ 12:00:00 | NDC 63323018610

## 2013-09-05 MED ADMIN — famotidine (PF) (PEPCID) 20 mg in sodium chloride 0.9 % 10 mL injection: INTRAVENOUS | @ 01:00:00 | NDC 63323018610

## 2013-09-05 MED ADMIN — 0.9% sodium chloride infusion: INTRAVENOUS | @ 17:00:00 | NDC 00409798309

## 2013-09-05 MED ADMIN — Saccharomyces boulardii (FLORASTOR) capsule 500 mg: ORAL | @ 22:00:00 | NDC 66825000201

## 2013-09-05 MED ADMIN — ciprofloxacin (CIPRO) 200 mg IVPB (premix): INTRAVENOUS | @ 02:00:00 | NDC 00409477723

## 2013-09-05 MED ADMIN — metroNIDAZOLE (FLAGYL) IVPB premix 500 mg: INTRAVENOUS | @ 01:00:00 | NDC 00409781124

## 2013-09-05 MED ADMIN — potassium chloride 10 mEq and lidocaine 10 mg in 100 ml NS IVPB: INTRAVENOUS | @ 02:00:00 | NDC 63323049227

## 2013-09-05 MED ADMIN — potassium chloride 10 mEq and lidocaine 10 mg in 100 ml NS IVPB: INTRAVENOUS | @ 04:00:00 | NDC 63323049227

## 2013-09-05 MED FILL — FAMOTIDINE (PF) 20 MG/2 ML IV: 20 mg/2 mL | INTRAVENOUS | Qty: 2

## 2013-09-05 MED FILL — POTASSIUM CHLORIDE 2 MEQ/ML IV SOLN: 2 mEq/mL | INTRAVENOUS | Qty: 5

## 2013-09-05 MED FILL — POTASSIUM CHLORIDE 2 MEQ/ML IV SOLN: 2 mEq/mL | INTRAVENOUS | Qty: 20

## 2013-09-05 MED FILL — SODIUM CHLORIDE 0.9 % INJECTION: INTRAMUSCULAR | Qty: 10

## 2013-09-05 MED FILL — CIPROFLOXACIN IN D5W 200 MG/100 ML IV PIGGY BACK: 200 mg/100 mL | INTRAVENOUS | Qty: 100

## 2013-09-05 MED FILL — FLORASTOR 250 MG CAPSULE: 250 mg | ORAL | Qty: 2

## 2013-09-05 MED FILL — POTASSIUM CHLORIDE 10 % ORAL LIQUID: 20 mEq/15 mL | ORAL | Qty: 30

## 2013-09-05 MED FILL — NS WITH POTASSIUM CHLORIDE 40 MEQ/L IV: 40 mEq/L | INTRAVENOUS | Qty: 1000

## 2013-09-05 MED FILL — SODIUM CHLORIDE 0.9 % IV: INTRAVENOUS | Qty: 1000

## 2013-09-05 MED FILL — METRONIDAZOLE IN SODIUM CHLORIDE (ISO-OSM) 500 MG/100 ML IV PIGGY BACK: 500 mg/100 mL | INTRAVENOUS | Qty: 100

## 2013-09-05 NOTE — Progress Notes (Signed)
Pt completed 3 runs of KCL IV with lidocaine, tolerated well.

## 2013-09-05 NOTE — Other (Signed)
Bedside and Verbal shift change report given to [facility]  RN (oncoming nurse) by Remi Haggard, LPN (offgoing nurse). Report included the following information SBAR, Kardex, MAR and Recent Results.    SITUATION:   ? Code Status: Full Code  ? Reason for Admission: AKI (acute kidney injury) (HCC)  ? Acute kidney failure (HCC)    ? Hospital day: 1  ? Problem List:       Hospital Problems  Never Reviewed        Codes Class Noted POA    AKI (acute kidney injury) (HCC) ICD-9-CM:  584.9  ICD-10-CM:  N17.9  09/04/2013 Unknown        Acute kidney failure [facility] ICD-9-CM:  584.9  ICD-10-CM:  N17.9  09/04/2013 Unknown              BACKGROUND:    Past Medical History: No past medical history on file.      Patient taking anticoagulants no     ASSESSMENT:   ? Changes in Assessment Throughout Shift: NO    ? Patient has Central Line: no Reasons if yes:   ? Patient has Foley Cath: no Reasons if yes:      ? Last Vitals:     Filed Vitals:    09/05/13 1200   BP: 128/76   Pulse: 83   Temp: 96.7 ??F (35.9 ??C)   Resp: 18   Height:    Weight:    SpO2: 99%       ? IV and DRAINS (will only show if present)   [REMOVED] Peripheral IV 09/04/13 Left Antecubital-Site Assessment: Clean, dry, & intact  Peripheral IV 09/04/13 Right Hand-Site Assessment: Clean, dry, & intact  [REMOVED] Peripheral IV 09/04/13 Right Antecubital-Site Assessment: Clean, dry, & intact    ? WOUND (if present)   Wound Type:  none   Dressing present Dressing Present : No   Wound Concerns/Notes:  none    ? PAIN    Pain Assessment    Pain Intensity 1: 0 (09/05/13 1600)              Patient Stated Pain Goal: 0  o Interventions for Pain: NO C/O DISCOMFORT Intervention effective: N/A  o Time of last intervention:N/A  Reassessment Completed:  N/A  ? Last 3 Weights:  Last 3 Recorded Weights in this Encounter    09/04/13 2154   Weight: 59.421 kg (131 lb)     Weight change:     ? INTAKE/OUPUT    Current Shift: 09/03 0700 - 09/03 1859  In: -   Out: 200 [Urine:200]     Last three shifts: 09/01 1900 - 09/03 0659  In: 240 [P.O.:240]  Out: 400 [Urine:400]    ? LAB RESULTS     Recent Labs      09/05/13   0148  09/04/13   1016   WBC  7.9  12.2   HGB  12.0  14.3   HCT  35.0  40.7   PLT  216  305        Recent Labs      09/05/13   0148  09/04/13   1016   NA  144  139   K  3.9  2.7*   GLU  90  110*   BUN  23*  27*   CREA  2.08*  2.59*   CA  8.4*  9.6   MG   --   2.2       RECOMMENDATIONS  AND DISCHARGE PLANNING     1. Pending tests/procedures/ Plan of Care or Other Needs: [place] OF ABDOMEN IN AM,AM LABS     2. Discharge plan for patient and Needs/Barriers: DISCHARGE TO HOME    3. Estimated Discharge Date: 09/07/13 Posted on Whiteboard in Patient???s Room: yes       "HEALS" SAFETY CHECK      Fall Risk    Total Score: 3    Safety Measures: Safety Measures: Bed/Chair-Wheels locked, Bed in low position, Call light within reach    A safety check occurred in the patient's room between off going nurse and oncoming nurse listed above.    The safety check included the below items  Area Items   H  High Alert Medications ? Verify all high alert medication drips (heparin, PCA, etc.)   E  Equipment ? Suction is set up for ALL patients (with yanker)  ? Red plugs utilized for all equipment (IV pumps, etc.)  ? WOW???s wiped down at end of shift.  ? Room stocked with oxygen, suction, and other unit-specific supplies   A  Alarms ? Bed alarm is set for fall risk patients  ? Ensure chair alarm is in place and activated if patient is up in a chair   L  Lines ? Check IV for any infiltration  ? Foley bag is empty if patient has a Foley   ? Tubing and IV bags are labeled   S  Safety   ? Room is clean, patient is clean, and equipment is clean.  ? Hallways are clear from equipment besides carts.   ? Fall bracelet on for fall risk patients  ? Ensure room is clear and free of clutter  ? Suction is set up for ALL patients (with yanker)  ? Hallways are clear from equipment besides carts.    ? Isolation precautions followed, supplies available outside room, sign posted     Remi Haggard, LPN

## 2013-09-05 NOTE — Other (Signed)
Bedside and Verbal shift change report given to B. Barner LPN (oncoming nurse) by Lesli Albee, RN (offgoing nurse). Report included the following information SBAR, Kardex, MAR and Recent Results.    SITUATION:   ? Code Status: Full Code  ? Reason for Admission: AKI (acute kidney injury) (HCC)  ? Acute kidney failure (HCC)    ? Hospital day: 1  ? Problem List:       Hospital Problems  Never Reviewed        Codes Class Noted POA    AKI (acute kidney injury) (HCC) ICD-9-CM:  584.9  ICD-10-CM:  N17.9  09/04/2013 Unknown        Acute kidney failure [facility]) ICD-9-CM:  584.9  ICD-10-CM:  N17.9  09/04/2013 Unknown              BACKGROUND:    Past Medical History: No past medical history on file.      Patient taking anticoagulants Pt refused SCDs    ASSESSMENT:   ? Changes in Assessment Throughout Shift: none    ? Patient has Central Line: no Reasons if yes:   ? Patient has Foley Cath: no Reasons if yes:      ? Last Vitals:     Filed Vitals:    09/05/13 0329   BP: 131/70   Pulse: 77   Temp: 98.3 ??F (36.8 ??C)   Resp: 18   Height:    Weight:    SpO2: 98%       ? IV and DRAINS (will only show if present)   [REMOVED] Peripheral IV 09/04/13 Left Antecubital-Site Assessment: Clean, dry, & intact  Peripheral IV 09/04/13 Right Hand-Site Assessment: Clean, dry, & intact  [REMOVED] Peripheral IV 09/04/13 Right Antecubital-Site Assessment: Clean, dry, & intact    ? WOUND (if present)   Wound Type:  none   Dressing present Dressing Present : No   Wound Concerns/Notes:  none    ? PAIN    Pain Assessment    Pain Intensity 1: 0 (09/05/13 0216)              Patient Stated Pain Goal: 0  o Interventions for Pain:  No c/o pain this shift  o Intervention effective: yes  o Time of last intervention:    o Reassessment Completed: yes     ? Last 3 Weights:  Last 3 Recorded Weights in this Encounter    09/04/13 2154   Weight: 59.421 kg (131 lb)     Weight change:     ? INTAKE/OUPUT    Current Shift: 09/02 1900 - 09/03 0659  In: -    Out: 200 [Urine:200]    Last three shifts:      ? LAB RESULTS     Recent Labs      09/05/13   0148  09/04/13   1016   WBC  7.9  12.2   HGB  12.0  14.3   HCT  35.0  40.7   PLT  216  305        Recent Labs      09/05/13   0148  09/04/13   1016   NA  144  139   K  3.9  2.7*   GLU  90  110*   BUN  23*  27*   CREA  2.08*  2.59*   CA  8.4*  9.6   MG   --   2.2       RECOMMENDATIONS AND  DISCHARGE PLANNING     1. Pending tests/procedures/ Plan of Care or Other Needs: routine labs, ova and parasites, stool culture, WBC stool, pt has period of confusion and memory loss    2. Discharge plan for patient and Needs/Barriers: d/c to assisted living     3. Estimated Discharge Date: 09/07/13 Posted on Whiteboard in Patient???s Room: yes     "HEALS" SAFETY CHECK      Fall Risk    Total Score: 3    Safety Measures: Safety Measures: Bed/Chair-Wheels locked, Bed in low position, Call light within reach (pt uses cane)    A safety check occurred in the patient's room between off going nurse and oncoming nurse listed above.    The safety check included the below items  Area Items   H  High Alert Medications ? Verify all high alert medication drips (heparin, PCA, etc.)   E  Equipment ? Suction is set up for ALL patients (with yanker)  ? Red plugs utilized for all equipment (IV pumps, etc.)  ? WOW???s wiped down at end of shift.  ? Room stocked with oxygen, suction, and other unit-specific supplies   A  Alarms ? Bed alarm is set for fall risk patients  ? Ensure chair alarm is in place and activated if patient is up in a chair   L  Lines ? Check IV for any infiltration  ? Foley bag is empty if patient has a Foley   ? Tubing and IV bags are labeled   S  Safety   ? Room is clean, patient is clean, and equipment is clean.  ? Hallways are clear from equipment besides carts.   ? Fall bracelet on for fall risk patients  ? Ensure room is clear and free of clutter  ? Suction is set up for ALL patients (with yanker)   ? Hallways are clear from equipment besides carts.   ? Isolation precautions followed, supplies available outside room, sign posted     Lesli Albee, RN

## 2013-09-05 NOTE — Progress Notes (Signed)
Feels well. Abdominal exam is benign. No pain or fever. Stool is forming up.  C diff negative.  PE: BP 131/70 mmHg   Pulse 77   Temp(Src) 98.3 ??F (36.8 ??C)   Resp 18   Ht  (1.6 m)   Wt 59.421 kg (131 lb)   BMI 23.21 kg/m2   SpO2 98%   Breastfeeding? No  Abdomen benign  Lungs CTA  Recent Results (from the past 12 hour(s))   METABOLIC PANEL, BASIC    Collection Time: 09/05/13  1:48 AM   Result Value Ref Range    Sodium 144 136 - 145 mmol/L    Potassium 3.9 3.5 - 5.5 mmol/L    Chloride 112 (H) 100 - 108 mmol/L    CO2 22 21 - 32 mmol/L    Anion gap 10 3.0 - 18 mmol/L    Glucose 90 74 - 99 mg/dL    BUN 23 (H) 7.0 - 18 MG/DL    Creatinine 1.61 (H) 0.6 - 1.3 MG/DL    BUN/Creatinine ratio 11 (L) 12 - 20      GFR est AA 30 (L) >60 ml/min/1.60m2    GFR est non-AA 25 (L) >60 ml/min/1.77m2    Calcium 8.4 (L) 8.5 - 10.1 MG/DL   CBC W/O DIFF    Collection Time: 09/05/13  1:48 AM   Result Value Ref Range    WBC 7.9 4.6 - 13.2 K/uL    RBC 3.84 (L) 4.20 - 5.30 M/uL    HGB 12.0 12.0 - 16.0 g/dL    HCT 09.6 04.5 - 40.9 %    MCV 91.1 74.0 - 97.0 FL    MCH 31.3 24.0 - 34.0 PG    MCHC 34.3 31.0 - 37.0 g/dL    RDW 81.1 91.4 - 78.2 %    PLATELET 216 135 - 420 K/uL    MPV 10.5 9.2 - 11.8 FL   WBC, STOOL    Collection Time: 09/05/13  4:15 AM   Result Value Ref Range    White blood cells, stool MODERATE /HPF     A/P: The diarrhea probably associated with abx use. The nausea and anorexia related to the antibiotics. Does not have c diff. Sound the diverticulitis has resolved. Stop ABX and star probiotics. Regular diet. Ambulate patient.     A Tamora Huneke MD

## 2013-09-05 NOTE — Progress Notes (Signed)
[facility] Hospitalist Group  Progress Note    Patient: Judith Jenkins Age: 73 y.o. DOB: 09-25-1940 MR#: [id number] SSN: [id number]  Date/Time: 09/05/2013     Subjective: pt feeling better, diarrhea better. No abd pain      Assessment/Plan:   1. Diarrhea: ? abx related, agree with stopping Abx and starting probiotics, C diff neg.   2. ARF: cont IVf and monitor   3. Hypokalemia better  4. HTN: BP stable, off med's  5. Recent diverticulitis s/p Rx  6. Asymptomatic chololithiasis   Home in 1-2 days if renal function better     Case discussed with:  Patient  Family  Nursing  Case Management  DVT Prophylaxis:  Lovenox  Hep SQ  SCDs  Coumadin   On Heparin gtt    Objective:   VS: BP 128/76 mmHg   Pulse 83   Temp(Src) 96.7 ??F (35.9 ??C)   Resp 18   Ht  (1.6 m)   Wt 59.421 kg (131 lb)   BMI 23.21 kg/m2   SpO2 99%   Breastfeeding? No   Tmax/24hrs: Temp (24hrs), Avg:97.7 ??F (36.5 ??C), Min:96.7 ??F (35.9 ??C), Max:98.3 ??F (36.8 ??C)  IOBRIEF  Intake/Output Summary (Last 24 hours) at 09/05/13 1239  Last data filed at 09/05/13 1000   Gross per 24 hour   Intake    240 ml   Output    600 ml   Net   -360 ml       General:  Alert, cooperative, no acute distress????  HEENT: PERRLA, anicteric sclerae.  Pulmonary:  CTA Bilaterally. No Wheezing/Rhonchi/Rales.  Cardiovascular: Regular rate and Rhythm.  GI:  Soft, Non distended, Non tender. + Bowel sounds.  Extremities:  No edema, cyanosis, clubbing. No calf tenderness.   Psych: Good insight.??Not anxious or agitated.  Neurologic: Alert and oriented X 3. No acute neuro deficits.      Labs:    Recent Results (from the past 24 hour(s))   C. DIFFICILE/EPI PCR    Collection Time: 09/04/13  2:50 PM   Result Value Ref Range    Special Requests: NO SPECIAL REQUESTS      Culture result:        Toxigenic C. difficile NEGATIVE                         C. difficile target DNA sequences are not detected.   CULTURE, BLOOD    Collection Time: 09/04/13  3:30 PM    Result Value Ref Range    Special Requests: NO SPECIAL REQUESTS      Culture result: NO GROWTH AFTER 18 HOURS     LACTIC ACID, PLASMA    Collection Time: 09/04/13  3:30 PM   Result Value Ref Range    Lactic acid 1.1 0.4 - 2.0 MMOL/L   CULTURE, BLOOD    Collection Time: 09/04/13  3:40 PM   Result Value Ref Range    Special Requests: NO SPECIAL REQUESTS      Culture result: NO GROWTH AFTER 18 HOURS     METABOLIC PANEL, BASIC    Collection Time: 09/05/13  1:48 AM   Result Value Ref Range    Sodium 144 136 - 145 mmol/L    Potassium 3.9 3.5 - 5.5 mmol/L    Chloride 112 (H) 100 - 108 mmol/L    CO2 22 21 - 32 mmol/L    Anion gap 10 3.0 - 18 mmol/L  Glucose 90 74 - 99 mg/dL    BUN 23 (H) 7.0 - 18 MG/DL    Creatinine 1.61 (H) 0.6 - 1.3 MG/DL    BUN/Creatinine ratio 11 (L) 12 - 20      GFR est AA 30 (L) >60 ml/min/1.80m2    GFR est non-AA 25 (L) >60 ml/min/1.43m2    Calcium 8.4 (L) 8.5 - 10.1 MG/DL   CBC W/O DIFF    Collection Time: 09/05/13  1:48 AM   Result Value Ref Range    WBC 7.9 4.6 - 13.2 K/uL    RBC 3.84 (L) 4.20 - 5.30 M/uL    HGB 12.0 12.0 - 16.0 g/dL    HCT 09.6 04.5 - 40.9 %    MCV 91.1 74.0 - 97.0 FL    MCH 31.3 24.0 - 34.0 PG    MCHC 34.3 31.0 - 37.0 g/dL    RDW 81.1 91.4 - 78.2 %    PLATELET 216 135 - 420 K/uL    MPV 10.5 9.2 - 11.8 FL   WBC, STOOL    Collection Time: 09/05/13  4:15 AM   Result Value Ref Range    White blood cells, stool MODERATE /HPF       Signed By: Loleta Books, MD     September 05, 2013

## 2013-09-05 NOTE — Progress Notes (Signed)
NUTRITION assessment    [facility] referral    NUTRITION PLAN:     73 year old female with history of HTN, hypercholesterolemia admitted with AKI, n/v/d, and diverticulitis of large intestine without perforation or abscess.      Patient had poor po intake x1 week PTA per notation.  Attempted to speak with patient; was with various medical personnel at time of visit.  Spoke with nursing.  Good po intake of meals today.  Last weight was on 08/24/13; 130 lbs.  BM today; diarrhea.      Recommendations:  No nutritional diagnosis at this time.  Patient likely to meet estimated nutrition needs via adequate intake of meals.  RD to follow.    SUBJECTIVE/OBJECTIVE:     Diet: Regular; soft diet  Food Allergies:  None known    Meal Intake:  No data found.    Cultural, religious and ethnic food preferences identified:  None    Yes       Intake/Output Summary (Last 24 hours) at 09/05/13 1732  Last data filed at 09/05/13 1000   Gross per 24 hour   Intake    240 ml   Output    600 ml   Net   -360 ml       BM:  9/3 - diarrhea  Skin Integrity:  No pressure ulcer or wounds  Edema:  None     Labs: Lab Results   Component Value Date/Time    SODIUM 144 09/05/2013 01:48 AM    POTASSIUM 3.9 09/05/2013 01:48 AM    CHLORIDE 112 09/05/2013 01:48 AM    CO2 22 09/05/2013 01:48 AM    GLUCOSE 90 09/05/2013 01:48 AM    BUN 23 09/05/2013 01:48 AM    CREATININE 2.08 09/05/2013 01:48 AM    CALCIUM 8.4 09/05/2013 01:48 AM    ALBUMIN 3.5 09/04/2013 10:16 AM    MAGNESIUM 2.2 09/04/2013 10:16 AM    Lab Results   Component Value Date/Time    GLUCOSE 90 09/05/2013 01:48 AM       Pertinent Medications: Reviewed     BP 129/73 mmHg   Pulse 88   Temp(Src) 98 ??F (36.7 ??C)   Resp 18   Ht 5' 3"  (1.6 m)   Wt 59.421 kg (131 lb)   BMI 23.21 kg/m2   SpO2 96%   Breastfeeding? No        Ht Readings from Last 1 Encounters:   09/04/13 5' 3"  (1.6 m)     Last 3 Recorded Weights in this Encounter    09/04/13 2154   Weight: 59.421 kg (131 lb)        Body mass index is 23.21 kg/(m^2).    Weight History:  Unknown.  Weighed 130 lbs on 08/24/13 per chart history.      Encounter Diagnoses     ICD-9-CM ICD-10-CM   1. AKI (acute kidney injury) ([place]) 584.9 N17.9   2. Dehydration 276.51 E86.0   3. Nausea with vomiting 787.01 R11.2   4. Diarrhea 787.91 R19.7   5. Diverticulitis of large intestine without perforation or abscess without bleeding 562.11 K57.32       No past medical history on file.    Education & Learning Limitations:    None Identified     Identified and addressed     NUTRITION NEEDS & DIAGNOSIS:     Estimated Nutrition Needs:  1271-1377 kcal (MSJ x 1.2-1.3), 159-172 g CHO (50% kcal), 47-59 g Pro (0.8-1.0 g/kg), 1 mL/kcal  Based on Actual BW  (59 kg)    Patient expected to meet estimated nutrient needs through next review:    Yes     No     Nutrition Diagnoses:   No nutritional diagnosis at this time    NUTRITION MONITORING & EVALUATION:     Nutrition Goals:  1. Estimated nutritional needs will be met through adequate oral intake of meals daily.  Outcome:    Met/Ongoing         Previous Recommendations:  [  ] Implemented     [  ] Not Implemented  [ x ] N/A     Discharge Planning:  Regular diet     Participated in care planning, discharge planning, & interdisciplinary rounds as appropriate.      Coralee North, RD   Pager:  [phone]

## 2013-09-05 NOTE — Progress Notes (Signed)
Chaplain conducted an initial consultation and Spiritual Assessment for Judith Jenkins, who is a 73 y.o.,female. Patient???s Primary Language is: [place].   According to the patient???s EMR Religious Affiliation is: Catholic.     The reason the Patient came to the hospital is:   Patient Active Problem List    Diagnosis Date Noted   ??? AKI (acute kidney injury) ([facility]) 09/04/2013   ??? Acute kidney failure ([facility]) 09/04/2013        The Chaplain provided the following Interventions:  Initiated a relationship of care and support.   Explored issues of faith, belief, spirituality and religious/ritual needs while hospitalized.  Listened empathically.  Provided chaplaincy education.  Provided information about Spiritual Care Services.  Offered prayer and assurance of continued prayers on patient's behalf.   Chart reviewed.    The following outcomes were achieved:  Patient shared limited information about both their medical narrative and spiritual journey/beliefs.  Patient processed feeling about current hospitalization.  Patient expressed gratitude for the Chaplain's visit.    Assessment:  Patient does not have any religious/cultural needs that will affect patient???s preferences in health care.  Patient did not indicate any spiritual or religious issues which require Spiritual Care Services interventions at this time.       Plan:  Chaplains will continue to follow and will provide pastoral care on an as needed/requested basis.  Chaplain recommends bedside caregivers page chaplain on duty if patient shows signs of acute spiritual or emotional distress.    Fr. Darliss Ridgel, M.[employer]  [phone]

## 2013-09-05 NOTE — Consults (Signed)
Consult Note  Consult requested by: Dr. Matilde Bash (ER MD)  Judith Jenkins is a 73 y.o. 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 Nausea   ??? Vomiting     Admission diagnosis: <principal problem not specified>     HPI: 74 yo Caucasian female admitted for persistent abd pain N/V. She was recently seen in [facility] (8/22) for the same complaint of abd pain, diarrhea and vomiting for several daysand was Dx with Diverticulitis and sent home with Cipro and Flagyl. She however did not feel any better hence the return to ER. In the ER there apparently was episodes of "SZ" witnessed by her son.  She denies any fever/chills, no urinary complaints.  She currently feels better, stools are starting to form, less freq and less volume, abd pain is better. No further vomiting, tolerating some jello.    Hx of HTN for at least 15 years, NO DM MI or CVA. She does have hyperlipidemia and takes meds for it. BP control on HCTZ and Lisinopril is good (120-140 systolic). No prior hx of SZ.  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    No prior hx of kidney dse, no gross hematuria or proteinuria  No new meds or changes to meds.  She may have lost some weight in the last few weeks.    Family and surgical hx is unremarkable    No past medical history on file.   No past surgical history on file.    History     Social History   ??? Marital Status: SINGLE     Spouse Name: N/A     Number of Children: N/A   ??? Years of Education: N/A     Occupational History   ??? Not on file.     Social History Main Topics   ??? Smoking status: Not on file   ??? Smokeless tobacco: Not on file   ??? Alcohol Use: Not on file   ??? Drug Use: Not on file   ??? Sexual Activity: Not on file     Other Topics Concern   ??? Not on file     Social History Narrative   ??? No narrative on file       No family history on file.  Allergies    Allergen Reactions   ??? Sulfa (Sulfonamide Antibiotics) Nausea and Vomiting        Home Medications:     Prior to Admission Medications   Prescriptions Last Dose Informant Patient Reported? Taking?   ciprofloxacin HCl (CIPRO) 500 mg tablet   No No   Sig: Take 1 Tab by mouth two (2) times a day for 10 days.   hydrochlorothiazide (MICROZIDE) 12.5 mg capsule 09/04/2013 at 0900  Yes Yes   Sig: Take 12.5 mg by mouth daily. Indications: HYPERTENSION   lisinopril (PRINIVIL, ZESTRIL) 20 mg tablet 09/04/2013 at 0900  Yes Yes   Sig: Take 20 mg by mouth daily. Indications: HYPERTENSION   metroNIDAZOLE (FLAGYL) 500 mg tablet   No No   Sig: Take 1 Tab by mouth three (3) times daily for 10 days.   ondansetron (ZOFRAN ODT) 4 mg disintegrating tablet 09/03/2013 at Unknown time  No Yes   Sig: Take 1-2 tablets every 6-8 hours as needed for nausea and vomiting.   oxyCODONE-acetaminophen (PERCOCET)  5-325 mg per tablet Unknown at Unknown time  No No   Sig: Take 1 tablet every 4-6 hours as needed for pain control.  If you were instructed to try over the counter ibuprofen or tylenol, only take the percocet for pain not controlled with the over the counter medication. simvastatin (ZOCOR) 40 mg tablet 09/03/2013 at Unknown time  Yes Yes   Sig: Take 40 mg by mouth nightly. Indications: HYPERCHOLESTEROLEMIA      Facility-Administered Medications: None       Current Facility-Administered Medications   Medication Dose Route Frequency   ??? 0.9% sodium chloride with KCl 40 mEq/L infusion   IntraVENous CONTINUOUS   ??? Saccharomyces boulardii (FLORASTOR) capsule 500 mg  500 mg Oral BID   ??? acetaminophen (TYLENOL) tablet 650 mg  650 mg Oral Q4H PRN   ??? acetaminophen (TYLENOL) tablet 650 mg  650 mg Oral Q4H PRN   ??? ondansetron (ZOFRAN) injection 6 mg  6 mg IntraVENous Q6H PRN       Review of Systems:   A comprehensive review of systems was negative except for that written in the HPI.  Data Review:    Labs: Results:       Chemistry Recent Labs      09/05/13    0148  09/04/13   1016   GLU  90  110*   NA  144  139   K  3.9  2.7*   CL  112*  100   CO2  22  26   BUN  23*  27*   CREA  2.08*  2.59* CA  8.4*  9.6   AGAP  10  13   BUCR  11*  10*   AP   --   79   TP   --   7.3   ALB   --   3.5   GLOB   --   3.8   AGRAT   --   0.9      CBC w/Diff Recent Labs      09/05/13   0148  09/04/13   1016   WBC  7.9  12.2   RBC  3.84*  4.53   HGB  12.0  14.3   HCT  35.0  40.7   PLT  216  305   GRANS   --   77*   LYMPH   --   14*   EOS   --   1      Coagulation No results for input(s): PTP, INR, APTT in the last 72 hours.    Iron/Ferritin No results for input(s): IRON in the last 72 hours.    Invalid input(s): TIBCCALC   BNP No results for input(s): BNPP in the last 72 hours.   Cardiac Enzymes Recent Labs      09/04/13   1016   CPK  27   CKND1  CANNOT BE CALCULATED      Liver Enzymes Recent Labs      09/04/13   1016   TP  7.3   ALB  3.5   AP  79   SGOT  15      Thyroid Studies Lab Results   Component Value Date/Time    TSH 1.66 06/10/2008 08:35 AM        Mag 2.2    IMAGES: CXR Xray no acute infiltrates   Abd Xray, no pneumo, no obst    CULTURE: Blood c/s neg so far  Stool c/s  neg so far  Stool C diff neg    Lactic acid 1.1      Physical Assessment:     Visit Vitals   Item Reading   ??? BP 131/70 mmHg   ??? Pulse 77   ??? Temp 98.3 ??F (36.8 ??C)   ??? Resp 18   ??? Ht  (1.6 m)   ??? Wt 59.421 kg (131 lb)   ??? BMI 23.21 kg/m2   ??? SpO2 98%   ??? Breastfeeding No     Last 3 Recorded Weights in this Encounter    09/04/13 2154   Weight: 59.421 kg (131 lb)       Intake/Output Summary (Last 24 hours) at 09/05/13 1146  Last data filed at 09/05/13 1000   Gross per 24 hour   Intake    240 ml   Output    600 ml   Net   -360 ml       Physial Exam:  General appearance: alert, cooperative, no distress, appears older than stated age  Skin: normal coloration and turgor, no rashes, no suspicious skin lesions noted.  HEENT: non icteric, pinkish conj, dry lips and mucosa  Neck: no JVD   Lungs: clear to auscultation bilaterally  Heart: regular rate and rhythm, S1, S2 normal, no murmur, click, rub or gallop  Abdomen: soft, sl tenderness on deep palpation no rebound. Bowel sounds normal. No masses,  no organomegaly  Extremities: extremities normal, atraumatic, no cyanosis or edema    IMPRESSION AND PLAN:   ARF most likely prerenal due to dec po intake/inc fluid loses in the setting of diuretic and ACEI use. Crea improving, cont with IVF/pohydration, hold diuretics and ACEI until renal function returns to baseline. Avoid nephrotoxic drugs and adjust ,meds to renal fxn.  Discussed with patient and son about holding diuretics and ACEI during period of inc fluid losses or dec intake and avoid NSAIDs. No further renal recommendation at this time.   Hypokalemia resolved     Thank you for this consult.     Earnestine Leys, MD  September 05, 2013

## 2013-09-06 LAB — CBC WITH AUTOMATED DIFF
ABS. BASOPHILS: 0 10*3/uL (ref 0.0–0.1)
ABS. EOSINOPHILS: 0.1 10*3/uL (ref 0.0–0.4)
ABS. LYMPHOCYTES: 0.9 10*3/uL (ref 0.9–3.6)
ABS. MONOCYTES: 0.5 10*3/uL (ref 0.05–1.2)
ABS. NEUTROPHILS: 4.8 10*3/uL (ref 1.8–8.0)
BASOPHILS: 1 % (ref 0–2)
EOSINOPHILS: 1 % (ref 0–5)
HCT: 33.8 % — ABNORMAL LOW (ref 35.0–45.0)
HGB: 11.3 g/dL — ABNORMAL LOW (ref 12.0–16.0)
LYMPHOCYTES: 14 % — ABNORMAL LOW (ref 21–52)
MCH: 30.8 PG (ref 24.0–34.0)
MCHC: 33.4 g/dL (ref 31.0–37.0)
MCV: 92.1 FL (ref 74.0–97.0)
MONOCYTES: 8 % (ref 3–10)
MPV: 10.8 FL (ref 9.2–11.8)
NEUTROPHILS: 76 % — ABNORMAL HIGH (ref 40–73)
PLATELET: 204 10*3/uL (ref 135–420)
RBC: 3.67 M/uL — ABNORMAL LOW (ref 4.20–5.30)
RDW: 14.2 % (ref 11.6–14.5)
WBC: 6.3 10*3/uL (ref 4.6–13.2)

## 2013-09-06 LAB — METABOLIC PANEL, COMPREHENSIVE
A-G Ratio: 0.9 (ref 0.8–1.7)
ALT (SGPT): 14 U/L (ref 13–56)
AST (SGOT): 17 U/L (ref 15–37)
Albumin: 2.5 g/dL — ABNORMAL LOW (ref 3.4–5.0)
Alk. phosphatase: 58 U/L (ref 45–117)
Anion gap: 7 mmol/L (ref 3.0–18)
BUN/Creatinine ratio: 9 — ABNORMAL LOW (ref 12–20)
BUN: 18 MG/DL (ref 7.0–18)
Bilirubin, total: 0.5 MG/DL (ref 0.2–1.0)
CO2: 23 mmol/L (ref 21–32)
Calcium: 8.5 MG/DL (ref 8.5–10.1)
Chloride: 115 mmol/L — ABNORMAL HIGH (ref 100–108)
Creatinine: 1.95 MG/DL — ABNORMAL HIGH (ref 0.6–1.3)
GFR est AA: 32 mL/min/{1.73_m2} — ABNORMAL LOW (ref >60)
GFR est non-AA: 27 mL/min/{1.73_m2} — ABNORMAL LOW (ref >60)
Globulin: 2.7 g/dL (ref 2.0–4.0)
Glucose: 90 mg/dL (ref 74–99)
Potassium: 3.6 mmol/L (ref 3.5–5.5)
Protein, total: 5.2 g/dL — ABNORMAL LOW (ref 6.4–8.2)
Sodium: 145 mmol/L (ref 136–145)

## 2013-09-06 LAB — MAGNESIUM: Magnesium: 1.8 mg/dL (ref 1.8–2.4)

## 2013-09-06 MED ORDER — SACCHAROMYCES BOULARDII 250 MG CAP
250 mg | ORAL_CAPSULE | Freq: Two times a day (BID) | ORAL | Status: AC
Start: 2013-09-06 — End: 2013-09-13

## 2013-09-06 MED ORDER — OTHER
Status: DC
Start: 2013-09-06 — End: 2015-09-19

## 2013-09-06 MED ADMIN — 0.9% sodium chloride infusion: INTRAVENOUS | @ 02:00:00 | NDC 00409798309

## 2013-09-06 MED ADMIN — Saccharomyces boulardii (FLORASTOR) capsule 500 mg: ORAL | @ 12:00:00 | NDC 66825000201

## 2013-09-06 MED FILL — FLORASTOR 250 MG CAPSULE: 250 mg | ORAL | Qty: 2

## 2013-09-06 NOTE — Progress Notes (Signed)
Care Management Interventions  PCP Verified by CM?: Yes (Dr. Gary Fleet)  Palliative Care Consult: No  Mode of Transport at Discharge: Other (see comment)  Care Management Consult: Yes  MyChart Signup: No  Discharge Durable Medical Equipment: No  Physical Therapy Consult: No  Occupational Therapy Consult: No  Speech Therapy Consult: No  Current Support Network: Own Home, Family Lives Nearby  Confirm Follow Up Transport: Family  Plan discussed with Pt/Family/Caregiver: Yes  Discharge Location  Discharge Placement: Home with home health  Pt is a 73 year old female in her room alone.  This pt was admitted due to AKI.  Pt is alert and oriented x 3, with  A pleasant attitude regarding her life and recovery.  Pt lives alone, with prior DME (cane).  Pt at this time plans to return home with the support of her family members and friends.  Pt's son will provide transportation home when ready for her discharge. SW available upon request.

## 2013-09-06 NOTE — Progress Notes (Signed)
Diarrhea: After Your Visit  Your Care Instructions     Diarrhea is loose, watery stools (bowel movements). The exact cause is often hard to find. Sometimes diarrhea is your body's way of getting rid of what caused an upset stomach. Viruses, food poisoning, and many medicines can cause diarrhea. Some people get diarrhea in response to emotional stress, anxiety, or certain foods.  Almost everyone has diarrhea now and then. It usually isn't serious, and your stools will return to normal soon. The important thing to do is replace the fluids you have lost, so you can prevent dehydration.  The doctor has checked you carefully, but problems can develop later. If you notice any problems or new symptoms, get medical treatment right away.  Follow-up care is a key part of your treatment and safety. Be sure to make and go to all appointments, and call your doctor if you are having problems. It's also a good idea to know your test results and keep a list of the medicines you take.  How can you care for yourself at home?  1. Watch for signs of dehydration, which means your body has lost too much water. Dehydration is a serious condition and should be treated right away. Signs of dehydration are:  1. Increasing thirst and dry eyes and mouth.  2. Feeling faint or lightheaded.  3. Darker urine, and a smaller amount of urine than normal.  2. To prevent dehydration, drink plenty of fluids, enough so that your urine is light yellow or clear like water. Choose water and other caffeine-free clear liquids until you feel better. If you have kidney, heart, or liver disease and have to limit fluids, talk with your doctor before you increase the amount of fluids you drink.  3. Begin eating small amounts of mild foods the next day, if you feel like it.  1. Try yogurt that has live cultures of Lactobacillus. (Check the label.)  2. Avoid spicy foods, fruits, alcohol, and caffeine until 48 hours after all symptoms are gone.   3. Avoid chewing gum that contains sorbitol.  4. Avoid dairy products (except for yogurt with Lactobacillus) while you have diarrhea and for 3 days after symptoms are gone.  4. The doctor may recommend that you take over-the-counter medicine, such as loperamide (Imodium), if you still have diarrhea after 6 hours. Read and follow all instructions on the label. Do not use this medicine if you have bloody diarrhea, a high fever, or other signs of serious illness. Call your doctor if you think you are having a problem with your medicine.  When should you call for help?  Call 911 anytime you think you may need emergency care. For example, call if:  1. You passed out (lost consciousness).  2. Your stools are maroon or very bloody.  Call your doctor now or seek immediate medical care if:  ?? You are dizzy or lightheaded, or you feel like you may faint.  ?? Your stools are black and look like tar, or they have streaks of blood.  ?? You have new or worse belly pain.  ?? You have symptoms of dehydration, such as:  ?? Dry eyes and a dry mouth.  ?? Passing only a little dark urine.  ?? Feeling thirstier than usual.  ?? You have a new or higher fever.  Watch closely for changes in your health, and be sure to contact your doctor if:  ?? Your diarrhea is getting worse.  ?? You see pus in the  diarrhea.  ?? You are not getting better after 2 days (48 hours).   Where can you learn more?   Go to [email or web address]  Enter [phone] in the search box to learn more about "Diarrhea: After Your Visit."   ?? 2006-2015 [employer]. Care instructions adapted under license by [employer] (which disclaims liability or warranty for this information). This care instruction is for use with your licensed healthcare professional. If you have questions about a medical condition or this instruction, always ask your healthcare professional. [employer] disclaims any warranty or liability for your use of this information.   Content Version: 10.5.422740; Current as of: June 06, 2012                Dehydration: After Your Visit  Your Care Instructions  Dehydration happens when your body loses too much fluid. This might happen when you do not drink enough water or you lose large amounts of fluids from your body because of diarrhea, vomiting, or sweating. Severe dehydration can be life-threatening.  Water and minerals called electrolytes help put your body fluids back in balance. Learn the early signs of fluid loss, and drink more fluids to prevent dehydration.  Follow-up care is a key part of your treatment and safety. Be sure to make and go to all appointments, and call your doctor if you are having problems. It's also a good idea to know your test results and keep a list of the medicines you take.  How can you care for yourself at home?  5. To prevent dehydration, drink plenty of fluids, enough so that your urine is light yellow or clear like water. Choose water and other caffeine-free clear liquids until you feel better. If you have kidney, heart, or liver disease and have to limit fluids, talk with your doctor before you increase the amount of fluids you drink.  6. If you do not feel like eating or drinking, try taking small sips of water, sports drinks, or other rehydration drinks.  7. Get plenty of rest.  To prevent dehydration  3. Add more fluids to your diet and daily routine, unless your doctor has told you not to.  4. During hot weather, drink more fluids. Drink even more fluids if you exercise a lot. Stay away from drinks with alcohol or caffeine.  5. Watch for the symptoms of dehydration. These include:  1. A dry, sticky mouth.  2. Dark yellow urine, and not much of it.  3. Dry and sunken eyes.  4. Feeling very tired.  6. Learn what problems can lead to dehydration. These include:  1. Diarrhea, fever, and vomiting.  2. Any illness with a fever, such as pneumonia or the flu.   3. Activities that cause heavy sweating, such as endurance races and heavy outdoor work in hot or humid weather.  4. Alcohol or drug abuse or withdrawal.  5. Certain medicines, such as cold and allergy pills (antihistamines), diet pills (diuretics), and laxatives.  6. Certain diseases, such as diabetes, cancer, and heart or kidney disease.  When should you call for help?  Call 911 anytime you think you may need emergency care. For example, call if:  ?? You passed out (lost consciousness).  Call your doctor now or seek immediate medical care if:  ?? You are confused and cannot think clearly.  ?? You are dizzy or lightheaded, or you feel like you may faint.  ?? You have signs of needing more fluids. You have sunken eyes and  a dry mouth, and you pass only a little dark urine.  ?? You cannot keep fluids down.  Watch closely for changes in your health, and be sure to contact your doctor if:  ?? You are not making tears.  ?? Your skin is very dry and sags slowly back into place after you pinch it.  ?? Your mouth and eyes are very dry.   Where can you learn more?   Go to [email or web address]  Enter Q814 in the search box to learn more about "Dehydration: After Your Visit."   ?? 2006-2015 [employer]. Care instructions adapted under license by [employer] (which disclaims liability or warranty for this information). This care instruction is for use with your licensed healthcare professional. If you have questions about a medical condition or this instruction, always ask your healthcare professional. [employer] disclaims any warranty or liability for your use of this information.  Content Version: 10.5.422740; Current as of: November 16, 2012                Diverticulitis: After Your Visit  Your Care Instructions     Diverticulitis occurs when pouches form in the wall of the colon and become inflamed or infected. It can be very painful. Diverticulitis may be  the result of a diet that is too low in fiber.  You can change your diet and take other steps to help your body heal and prevent future problems.  Follow-up care is a key part of your treatment and safety. Be sure to make and go to all appointments, and call your doctor if you are having problems. It's also a good idea to know your test results and keep a list of the medicines you take.  How can you care for yourself at home?  8. Drink plenty of fluids, enough so that your urine is light yellow or clear like water. If you have kidney, heart, or liver disease and have to limit fluids, talk with your doctor before you increase the amount of fluids you drink.  9. Stick to liquids or a bland diet (plain rice, bananas, dry toast or crackers, applesauce) until you are feeling better. Then you can return to regular foods and gradually increase the amount of fiber in your diet.  10. Use a heating pad set on low on your belly to relieve mild cramps and pain.  11. Get extra rest until you are feeling better.  12. If your doctor prescribed antibiotics, take them as directed. Do not stop taking them just because you feel better. You need to take the full course of antibiotics.  13. Take pain medicines exactly as directed.  1. If the doctor gave you a prescription medicine for pain, take it as prescribed.  2. If you are not taking a prescription pain medicine, ask your doctor if you can take an over-the-counter medicine.  To prevent future attacks of diverticulitis  7. Avoid constipation:  1. Include fruits, vegetables, beans, and whole grains in your diet each day. These foods are high in fiber.  2. Drink plenty of fluids, enough so that your urine is light yellow or clear like water. If you have kidney, heart, or liver disease and have to limit fluids, talk with your doctor before you increase the amount of fluids you drink.  3. Get some exercise every day. Build up slowly to 30 to 60 minutes a day  on 5 or more days of the week.  4. Take a fiber supplement, such  as Citrucel or Metamucil, every day if needed. Read and follow all instructions on the label.  5. Schedule time each day for a bowel movement. Having a daily routine may help. Take your time and do not strain when having a bowel movement.  When should you call for help?  Call 911 anytime you think you may need emergency care. For example, call if:  ?? You passed out (lost consciousness).  ?? You vomit blood or what looks like coffee grounds.  ?? You pass maroon or very bloody stools.  Call your doctor now or seek immediate medical care if:  ?? You have severe pain or swelling in your belly.  ?? You have a new or higher fever.  ?? You cannot keep down fluids or medicines.  ?? You have new pain that gets worse when you move or cough.  Watch closely for changes in your health, and be sure to contact your doctor if:  ?? The symptoms you had when you first started feeling sick come back.  ?? You do not get better as expected.   Where can you learn more?   Go to [email or web address]  Enter H901 in the search box to learn more about "Diverticulitis: After Your Visit."   ?? 2006-2015 [employer]. Care instructions adapted under license by [employer] (which disclaims liability or warranty for this information). This care instruction is for use with your licensed healthcare professional. If you have questions about a medical condition or this instruction, always ask your healthcare professional. [employer] disclaims any warranty or liability for your use of this information.  Content Version: 10.5.422740; Current as of: November 16, 2012                Nausea and Vomiting: After Your Visit  Your Care Instructions     When you are nauseated, you may feel weak and sweaty and notice a lot of saliva in your mouth. Nausea often leads to vomiting. Most of the time you  do not need to worry about nausea and vomiting, but they can be signs of other illnesses.  Two common causes of nausea and vomiting are stomach flu and food poisoning. Nausea and vomiting from viral stomach flu will usually start to improve within 24 hours. Nausea and vomiting from food poisoning may last from 12 to 48 hours.  The doctor has checked you carefully, but problems can develop later. If you notice any problems or new symptoms, get medical treatment right away.  Follow-up care is a key part of your treatment and safety. Be sure to make and go to all appointments, and call your doctor if you are having problems. It's also a good idea to know your test results and keep a list of the medicines you take.  How can you care for yourself at home?  14. To prevent dehydration, drink plenty of fluids, enough so that your urine is light yellow or clear like water. Choose water and other caffeine-free clear liquids until you feel better. If you have kidney, heart, or liver disease and have to limit fluids, talk with your doctor before you increase the amount of fluids you drink.  15. Rest in bed until you feel better.  16. When you are able to eat, try clear soups, mild foods, and liquids until all symptoms are gone for 12 to 48 hours. Other good choices include dry toast, crackers, cooked cereal, and gelatin dessert, such as Jell-O.  When should you call for help?  Call 911 anytime you think you may need emergency care. For example, call if:  8. You passed out (lost consciousness).  Call your doctor now or seek immediate medical care if:  ?? You have symptoms of dehydration, such as:  ?? Dry eyes and a dry mouth.  ?? Passing only a little dark urine.  ?? Feeling thirstier than usual.  ?? You have new or worsening belly pain.  ?? You have a new or higher fever.  ?? You vomit blood or what looks like coffee grounds.  Watch closely for changes in your health, and be sure to contact your doctor if:   ?? You have ongoing nausea and vomiting.  ?? Your vomiting is getting worse.  ?? Your vomiting lasts longer than 2 days.  ?? You are not getting better as expected.   Where can you learn more?   Go to [email or web address]  Enter H591 in the search box to learn more about "Nausea and Vomiting: After Your Visit."   ?? 2006-2015 [employer]. Care instructions adapted under license by [employer] (which disclaims liability or warranty for this information). This care instruction is for use with your licensed healthcare professional. If you have questions about a medical condition or this instruction, always ask your healthcare professional. [employer] disclaims any warranty or liability for your use of this information.  Content Version: 10.5.422740; Current as of: June 06, 2012                Acute Kidney Injury: After Your Visit  Your Care Instructions  Acute kidney injury is the sudden loss of kidney function that happens when the kidneys stop working over a period of hours, days or, in some cases, weeks. It is also known as acute renal failure. Common causes of acute kidney injury are dehydration, blood loss, and medicines.  When acute kidney injury happens, the kidneys cannot remove waste and excess fluids from the body. The waste and fluids build up and become harmful.  Kidney function may return to normal if the cause of acute kidney injury is treated quickly. Your chance of a full recovery depends on what caused the problem, how quickly the cause was treated, and what other medical problems you have. A machine may be used to help your kidneys remove waste and fluids for a short period of time. This is called dialysis.  Follow-up care is a key part of your treatment and safety. Be sure to make and go to all appointments, and call your doctor if you are having problems. It's also a good idea to know your test results and keep a list of the medicines you take.   How can you care for yourself at home?  17. Talk to your doctor about how much fluid you should drink.  18. Eat a balanced diet. Talk to your doctor or a dietitian about what type of diet may be best for you.  19. Follow the instructions and schedule for dialysis that your doctor gives you.  20. Do not smoke. Smoking can make your condition worse. If you need help quitting, talk to your doctor about stop-smoking programs and medicines. These can increase your chances of quitting for good.  21. Do not drink alcohol.  22. Review all of your medicines with your doctor. Do not take any medicines, including nonsteroidal anti-inflammatory drugs (NSAIDs) such as ibuprofen (Advil, Motrin) or naproxen (Aleve), unless your doctor says it is safe for you to do so.  23. Make  sure that anyone treating you for any health problem knows that you have had acute kidney injury.  When should you call for help?  Call 911 anytime you think you may need emergency care. For example, call if:  9. You passed out (lost consciousness).  10. You have severe trouble breathing.  Call your doctor now or seek immediate medical care if:  ?? You have less urine than normal or no urine.  ?? You have trouble urinating or can urinate only very small amounts.  ?? You are confused or have trouble thinking clearly.  ?? You feel weaker or more tired than usual.  ?? You are very thirsty, lightheaded, or dizzy.  ?? You have nausea and vomiting.  ?? You have new swelling of your arms or feet, or your swelling is worse.  ?? You have blood in your urine.  ?? Your body weight goes up every day.  ?? You have new or worse trouble breathing.  Watch closely for changes in your health, and be sure to contact your doctor if:  ?? You do not get better as expected.   Where can you learn more?   Go to [email or web address]  Enter [id number] in the search box to learn more about "Acute Kidney Injury: After Your Visit."    ?? 2006-2015 [employer]. Care instructions adapted under license by [employer] (which disclaims liability or warranty for this information). This care instruction is for use with your licensed healthcare professional. If you have questions about a medical condition or this instruction, always ask your healthcare professional. [employer] disclaims any warranty or liability for your use of this information.  Content Version: 10.5.422740; Current as of: November 16, 2012      Patient armband removed and shredded  MyChart Activation    Thank you for requesting access to MyChart. Please follow the instructions below to securely access and download your online medical record. MyChart allows you to send messages to your doctor, view your test results, renew your prescriptions, schedule appointments, and more.    How Do I Sign Up?    1. In your internet browser, go to [email or web address]  2. Click on the First Time User? Click Here link in the Sign In box. You will be redirect to the New Member Sign Up page.  3. Enter your MyChart Access Code exactly as it appears below. You will not need to use this code after you???ve completed the sign-up process. If you do not sign up before the expiration date, you must request a new code.    MyChart Access Code: [email or web address]  Expires: 11/22/2013  4:54 PM (This is the date your MyChart access code will expire)    4. Enter the last four digits of your Social Security Number (xxxx) and Date of Birth (mm/dd/yyyy) as indicated and click Submit. You will be taken to the next sign-up page.  5. Create a MyChart ID. This will be your MyChart login ID and cannot be changed, so think of one that is secure and easy to remember.  6. Create a MyChart password. You can change your password at any time.  7. Enter your Password Reset Question and Answer. This can be used at a later time if you forget your password.    8. Enter your e-mail address. You will receive e-mail notification when new information is available in MyChart.  9. Click Sign Up. You can now view and download portions of your medical record.  10. Click the Download  Summary menu link to download a portable copy of your medical information.    Additional Information    If you have questions, please visit the Frequently Asked Questions section of the MyChart website at [email or web address] Remember, MyChart is NOT to be used for urgent needs. For medical emergencies, dial 911.        DISCHARGE SUMMARY from Nurse    The following personal items are in your possession at time of discharge:    Dental Appliances: None  Visual Aid: None     Home Medications: None  Jewelry: None  Clothing: Shirt, Undergarments, With patient, Pants, Footwear  Other Valuables: None  Personal Items Sent to Safe: none          PATIENT INSTRUCTIONS:    After general anesthesia or intravenous sedation, for 24 hours or while taking prescription Narcotics:  ?? Limit your activities  ?? Do not drive and operate hazardous machinery  ?? Do not make important personal or business decisions  ?? Do  not drink alcoholic beverages  ?? If you have not urinated within 8 hours after discharge, please contact your surgeon on call.    Report the following to your surgeon:  ?? Excessive pain, swelling, redness or odor of or around the surgical area  ?? Temperature over 100.5  ?? Nausea and vomiting lasting longer than 4 hours or if unable to take medications  ?? Any signs of decreased circulation or nerve impairment to extremity: change in color, persistent  numbness, tingling, coldness or increase pain  ?? Any questions        What to do at Home:  Recommended activity: Activity as tolerated,     If you experience any of the following symptoms fever greater than 100, chest pain, excessive nausea, vomiting, and diarrhea, shortness of breath please follow up with PCP or return to ED.       *  Please give a list of your current medications to your Primary Care Provider.    *  Please update this list whenever your medications are discontinued, doses are      changed, or new medications (including over-the-counter products) are added.    *  Please carry medication information at all times in case of emergency situations.          These are general instructions for a healthy lifestyle:    No smoking/ No tobacco products/ Avoid exposure to second hand smoke    Surgeon General's Warning:  Quitting smoking now greatly reduces serious risk to your health.    Obesity, smoking, and sedentary lifestyle greatly increases your risk for illness    A healthy diet, regular physical exercise & weight monitoring are important for maintaining a healthy lifestyle    You may be retaining fluid if you have a history of heart failure or if you experience any of the following symptoms:  Weight gain of 3 pounds or more overnight or 5 pounds in a week, increased swelling in our hands or feet or shortness of breath while lying flat in bed.  Please call your doctor as soon as you notice any of these symptoms; do not wait until your next office visit.    Recognize signs and symptoms of STROKE:    F-face looks uneven    A-arms unable to move or move unevenly    S-speech slurred or non-existent    T-time-call 911 as soon as signs and symptoms begin-DO NOT go       Back to  bed or wait to see if you get better-TIME IS BRAIN.    Warning Signs of HEART ATTACK     Call 911 if you have these symptoms:  ? Chest discomfort. Most heart attacks involve discomfort in the center of the chest that lasts more than a few minutes, or that goes away and comes back. It can feel like uncomfortable pressure, squeezing, fullness, or pain.  ? Discomfort in other areas of the upper body. Symptoms can include pain or discomfort in one or both arms, the back, neck, jaw, or stomach.  ? Shortness of breath with or without chest discomfort.   ? Other signs may include breaking out in a cold sweat, nausea, or lightheadedness.  Don't wait more than five minutes to call 911 ??? MINUTES MATTER! Fast action can save your life. Calling 911 is almost always the fastest way to get lifesaving treatment. Emergency Medical Services staff can begin treatment when they arrive ??? up to an hour sooner than if someone gets to the hospital by car.       The discharge information has been reviewed with the patient.  The patient verbalized understanding.

## 2013-09-06 NOTE — Progress Notes (Signed)
Feels well. No diarrhea. Eating better. No nausea or heaving.  PE: BP 127/69 mmHg   Pulse 88   Temp(Src) 98.7 ??F (37.1 ??C)   Resp 20   Ht $R'5\' 3"'VL$  (1.6 m)   Wt 59.421 kg (131 lb)   BMI 23.21 kg/m2   SpO2 97%   Breastfeeding? No  Abdomen benign  Lungs CTA  Recent Results (from the past 12 hour(s))   CBC WITH AUTOMATED DIFF    Collection Time: 09/06/13 12:46 AM   Result Value Ref Range    WBC 6.3 4.6 - 13.2 K/uL    RBC 3.67 (L) 4.20 - 5.30 M/uL    HGB 11.3 (L) 12.0 - 16.0 g/dL    HCT 33.8 (L) 35.0 - 45.0 %    MCV 92.1 74.0 - 97.0 FL    MCH 30.8 24.0 - 34.0 PG    MCHC 33.4 31.0 - 37.0 g/dL    RDW 14.2 11.6 - 14.5 %    PLATELET 204 135 - 420 K/uL    MPV 10.8 9.2 - 11.8 FL    NEUTROPHILS 76 (H) 40 - 73 %    LYMPHOCYTES 14 (L) 21 - 52 %    MONOCYTES 8 3 - 10 %    EOSINOPHILS 1 0 - 5 %    BASOPHILS 1 0 - 2 %    ABS. NEUTROPHILS 4.8 1.8 - 8.0 K/UL    ABS. LYMPHOCYTES 0.9 0.9 - 3.6 K/UL    ABS. MONOCYTES 0.5 0.05 - 1.2 K/UL    ABS. EOSINOPHILS 0.1 0.0 - 0.4 K/UL    ABS. BASOPHILS 0.0 0.0 - 0.1 K/UL    DF AUTOMATED     MAGNESIUM    Collection Time: 09/06/13 12:46 AM   Result Value Ref Range    Magnesium 1.8 1.8 - 2.4 mg/dL   METABOLIC PANEL, COMPREHENSIVE    Collection Time: 09/06/13 12:46 AM   Result Value Ref Range    Sodium 145 136 - 145 mmol/L    Potassium 3.6 3.5 - 5.5 mmol/L    Chloride 115 (H) 100 - 108 mmol/L    CO2 23 21 - 32 mmol/L    Anion gap 7 3.0 - 18 mmol/L    Glucose 90 74 - 99 mg/dL    BUN 18 7.0 - 18 MG/DL    Creatinine 1.95 (H) 0.6 - 1.3 MG/DL    BUN/Creatinine ratio 9 (L) 12 - 20      GFR est AA 32 (L) >60 ml/min/1.19m2    GFR est non-AA 27 (L) >60 ml/min/1.40m2    Calcium 8.5 8.5 - 10.1 MG/DL    Bilirubin, total 0.5 0.2 - 1.0 MG/DL    ALT 14 13 - 56 U/L    AST 17 15 - 37 U/L    Alk. phosphatase 58 45 - 117 U/L    Protein, total 5.2 (L) 6.4 - 8.2 g/dL    Albumin 2.5 (L) 3.4 - 5.0 g/dL    Globulin 2.7 2.0 - 4.0 g/dL    A-G Ratio 0.9 0.8 - 1.7        A/P: Diverticulitis has resolved. Side effects of abx caused the diarrhea and nausea. Both have improved. Anemia is mild.   Renal insufficiency improving.  I had a long talk with patient about need for screening colonoscopy if has not been done in past. Will proceed with that and she understands the need to find a colon ca early. She will make follow up with me  A Tatyanna Cronk MD

## 2013-09-06 NOTE — Other (Signed)
Bedside and Verbal shift change report given to Lajoyce Corners, Charity fundraiser (oncoming nurse) by Jaymes Graff, RN (offgoing nurse). Report included the following information SBAR, Kardex, MAR and Recent Results.    SITUATION:   ? Code Status: Full Code  ? Reason for Admission: AKI (acute kidney injury) (HCC)  ? Acute kidney failure (HCC)    ? Hospital day: 2  ? Problem List:       Hospital Problems  Never Reviewed        Codes Class Noted POA    AKI (acute kidney injury) (HCC) ICD-9-CM:  584.9  ICD-10-CM:  N17.9  09/04/2013 Unknown        Acute kidney failure [facility]) ICD-9-CM:  584.9  ICD-10-CM:  N17.9  09/04/2013 Unknown              BACKGROUND:    Past Medical History: No past medical history on file.      Patient taking anticoagulants no     ASSESSMENT:   ? Changes in Assessment Throughout Shift: none    ? Patient has Central Line: no Reasons if yes:   ? Patient has Foley Cath: no Reasons if yes:      ? Last Vitals:     Filed Vitals:    09/06/13 0346   BP: 127/69   Pulse: 88   Temp: 98.7 ??F (37.1 ??C)   Resp: 20   Height:    Weight:    SpO2: 97%       ? IV and DRAINS (will only show if present)   [REMOVED] Peripheral IV 09/04/13 Left Antecubital-Site Assessment: Clean, dry, & intact  Peripheral IV 09/04/13 Right Hand-Site Assessment: Clean, dry, & intact  Saline Lock 09/05/13 Left Wrist-Site Assessment: Clean, dry, & intact  [REMOVED] Peripheral IV 09/04/13 Right Antecubital-Site Assessment: Clean, dry, & intact    ? WOUND (if present)   Wound Type:  none   Dressing present Dressing Present : No   Wound Concerns/Notes:  none    ? PAIN    Pain Assessment    Pain Intensity 1: 0 (09/05/13 1600)              Patient Stated Pain Goal: 0  o Interventions for Pain:  none  o Intervention effective: yes  o Time of last intervention:    o Reassessment Completed: yes     ? Last 3 Weights:  Last 3 Recorded Weights in this Encounter    09/04/13 2154   Weight: 59.421 kg (131 lb)     Weight change:     ? INTAKE/OUPUT    Current Shift:       Last three shifts: 09/02 1900 - 09/04 0659  In: 240 [P.O.:240]  Out: 1000 [Urine:1000]    ? LAB RESULTS     Recent Labs      09/06/13   0046  09/05/13   0148  09/04/13   1016   WBC  6.3  7.9  12.2   HGB  11.3*  12.0  14.3   HCT  33.8*  35.0  40.7   PLT  204  216  305        Recent Labs      09/06/13   0046  09/05/13   0148  09/04/13   1016   NA  145  144  139   K  3.6  3.9  2.7*   GLU  90  90  110*   BUN  18  23*  27*  CREA  1.95*  2.08*  2.59*   CA  8.5  8.4*  9.6   MG  1.8   --   2.2       RECOMMENDATIONS AND DISCHARGE PLANNING     1. Pending tests/procedures/ Plan of Care or Other Needs: labs     2. Discharge plan for patient and Needs/Barriers: home    3. Estimated Discharge Date: 09/07/13 Posted on Whiteboard in Patient???s Room: yes       "HEALS" SAFETY CHECK      Fall Risk    Total Score: 3    Safety Measures: Safety Measures: Bed/Chair-Wheels locked, Bed in low position, Call light within reach    A safety check occurred in the patient's room between off going nurse and oncoming nurse listed above.    The safety check included the below items  Area Items   H  High Alert Medications ? Verify all high alert medication drips (heparin, PCA, etc.)   E  Equipment ? Suction is set up for ALL patients (with yanker)  ? Red plugs utilized for all equipment (IV pumps, etc.)  ? WOW???s wiped down at end of shift.  ? Room stocked with oxygen, suction, and other unit-specific supplies   A  Alarms ? Bed alarm is set for fall risk patients  ? Ensure chair alarm is in place and activated if patient is up in a chair   L  Lines ? Check IV for any infiltration  ? Foley bag is empty if patient has a Foley   ? Tubing and IV bags are labeled   S  Safety   ? Room is clean, patient is clean, and equipment is clean.  ? Hallways are clear from equipment besides carts.   ? Fall bracelet on for fall risk patients  ? Ensure room is clear and free of clutter  ? Suction is set up for ALL patients (with yanker)   ? Hallways are clear from equipment besides carts.   ? Isolation precautions followed, supplies available outside room, sign posted     Jaymes Graff, RN

## 2013-09-06 NOTE — Progress Notes (Signed)
RENAL DAILY PROGRESS NOTE      IMPRESSION:   Acute  renal failure, improving kidney function with hydration   Diarrhea, better, c. Diff negative   HTN  Hypokalemia, better    PLAN:    plan to discharge today, diarrhea better, able to tolerate po  Encourage po intake  Follow up outpatient in 4 week.                Subjective:       Complaint:    Overnight events noted  Seems in good spirit  Admit diarrhea, better, good urine output.     Current Facility-Administered Medications   Medication Dose Route Frequency   ??? Saccharomyces boulardii (FLORASTOR) capsule 500 mg  500 mg Oral BID   ??? 0.9% sodium chloride infusion  100 mL/hr IntraVENous CONTINUOUS   ??? acetaminophen (TYLENOL) tablet 650 mg  650 mg Oral Q4H PRN   ??? acetaminophen (TYLENOL) tablet 650 mg  650 mg Oral Q4H PRN   ??? ondansetron (ZOFRAN) injection 6 mg  6 mg IntraVENous Q6H PRN       Review of Symptoms:no nausea, vomiting, chest pain, short of breath, cough, seizure.     Objective:   Patient Vitals for the past 24 hrs:   Temp Pulse Resp BP SpO2   09/06/13 0346 98.7 ??F (37.1 ??C) 88 20 127/69 mmHg 97 %   09/06/13 0000 98.6 ??F (37 ??C) 80 18 128/79 mmHg 95 %   09/05/13 2050 98.1 ??F (36.7 ??C) 80 18 132/78 mmHg 94 %   09/05/13 1601 98 ??F (36.7 ??C) 88 18 129/73 mmHg 96 %        Weight change:      09/02 1900 - 09/04 0659  In: 240 [P.O.:240]  Out: 1000 [Urine:1000]    Intake/Output Summary (Last 24 hours) at 09/06/13 1242  Last data filed at 09/05/13 2221   Gross per 24 hour   Intake      0 ml   Output    400 ml   Net   -400 ml     Physical Exam:   General: comfortable, no acute distress   sclera anicteric,  Neck: Supple,   CVS: S1S2 no rub  RS: + air entry b/l  Abd: soft,   Neuro: non focal, awake, alert   Extrm: no edema  Skin: no rash    Data Review:     LABS:   Hematology: Recent Labs      09/06/13   0046  09/05/13   0148  09/04/13   1016   WBC  6.3  7.9  12.2   HGB  11.3*  12.0  14.3   HCT  33.8*  35.0  40.7     Chemistry: Recent Labs      09/06/13    0046  09/05/13   0148  09/04/13   1016   BUN  18  23*  27*   CREA  1.95*  2.08*  2.59*   CA  8.5  8.4*  9.6   ALB  2.5*   --   3.5   K  3.6  3.9  2.7*   NA  145  144  139   CL  115*  112*  100   CO2  GLU  90  90  110*              Procedures/imaging: see electronic medical records for all procedures, Xrays and  details which were not copied into this note but were reviewed prior to creation of Plan          Assessment & Plan:   As above           [name], MD  09/06/2013  12:42 PM

## 2013-09-07 LAB — CULTURE, STOOL

## 2013-09-10 LAB — AMB POC PVR, MEAS,POST-VOID RES,US,NON-IMAGING: PVR: 0 cc

## 2013-09-10 LAB — OVA & PARASITES, STOOL

## 2013-09-10 LAB — CULTURE, BLOOD
Culture result:: NO GROWTH
Culture result:: NO GROWTH

## 2013-09-10 LAB — OVA & PARASITES, STOOL, REFLEX

## 2013-09-10 NOTE — Progress Notes (Signed)
Judith Jenkins  02/25/1940    Assessment:   Encounter Diagnoses   Name Primary?   ??? AKI (acute kidney injury) (HCC) Yes   ??? Acute prerenal azotemia    ??? Renal cyst, right, benign        Plan:  1. Discussed that the patient's increase in Creatinine while in the hospital was simply from dehydration. She is not having any urinary symptoms at this time. The patient may follow up on a PRN basis, sooner if she has any urologic issues.      Chief Complaint   Patient presents with   ??? Hospital Follow Up       History of Present Illness:  Judith Jenkins is a 73 y.o. female who presents today for hospital follow up.     ?? Patient was seen in [facility] ED on 08/24/13 and 09/04/13 for abdominal pain, nausea, vomiting and diarrhea. Imaging from both visits outlined below.  ?? She was admitted to the hospital on 09/04/13 for the above symptoms and discharged on 09/06/13.  ?? While there her Creatinine levels were found to be high and fell within normal limits by the time of discharge (2.59 to 1.95). Her kidney function improved with hydration.  ?? The patient denies any any urinary symptoms at this time. No gross hematuria or pus in the urine recently.  ?? She is not currently on any urologic medications.    Renal ultrasound 09/04/13 report reviewed and shows:  IMPRESSION:  Gallbladder sludge and stones without secondary evidence for acute  cholecystitis.  Small right renal cyst.    CT 08/24/13 report reviewed and shows:  IMPRESSION:  Colonic diverticulosis with greatest extent in the sigmoid colon where there is  mesenteric stranding, wall thickening and collection of free fluid suggestive of  acute diverticulitis.  Cholelithiasis without evidence for acute cholecystitis.  Benign right renal cyst and other hypodensities in the kidneys which are too  small to accurately characterize, probably also benign renal cysts.  Nonobstructive right nephrolithiasis.  Atherosclerotic disease.      History reviewed. No pertinent past medical history.     History reviewed. No pertinent past surgical history.    History   Substance Use Topics   ??? Smoking status: Not on file   ??? Smokeless tobacco: Not on file   ??? Alcohol Use: Not on file       Allergies   Allergen Reactions   ??? Sulfa (Sulfonamide Antibiotics) Nausea and Vomiting       History reviewed. No pertinent family history.    Current Outpatient Prescriptions   Medication Sig Dispense Refill   ??? Saccharomyces boulardii (FLORASTOR) 250 mg capsule Take 2 Caps by mouth two (2) times a day for 7 days. 28 Cap 0   ??? OTHER Check CBC, CMP, Mg in 4 days, results to PCP immediately, Diagnosis- AKI 1 Each 0   ??? simvastatin (ZOCOR) 40 mg tablet Take 40 mg by mouth nightly. Indications: HYPERCHOLESTEROLEMIA         Review of Systems  Constitutional: Fever: No;  Skin: Rash: No;  HEENT: Hearing difficulty: No;  Eyes: Blurred vision: No;  Cardiovascular: Chest pain: No;  Respiratory: Shortness of breath: No;  Gastrointestinal: Nausea/vomiting: No;  Musculoskeletal: Back pain: No;  Neurological: Weakness: Yes;  Psychological: Memory loss: Yes;  Comments/additional findings:   All other systems reviewed and are negative    Physical Exam:   BP 122/78 mmHg   Ht 5' (1.524 m)   Wt 130 lb (58.968  kg)   BMI 25.39 kg/m2  Constitutional: Well developed, well-nourished female in no acute distress  CV:  No peripheral swelling noted  Respiratory: No respiratory distress or difficulties  Abdomen:  Soft and nontender. No masses. No hepatosplenomegaly  GU Female:  No CVA tenderness. Normal external genitalia.  No vaginal discharge. No adnexal masses or tenderness.  No rectocele or cystocele.   Skin:  Normal color. No evidence of jaundice  Neuro/Psych:  Patient with appropriate affect.  Alert and oriented  Lymphatic:   No enlargement of supraclavicular lymph nodes    Review of Labs and Imaging:      UA: N/A      A copy of today's office visit with all pertinent imaging results and labs were sent to:   RISE Wyn Forster, MD     Sandi Raveling, MD on 09/10/2013     Documentation provided by Gery Pray, medical scribe for Sandi Raveling, MD on 09/10/2013.

## 2013-09-16 NOTE — Discharge Summary (Signed)
[id number]

## 2013-09-16 NOTE — Discharge Summary (Signed)
[facility]                               [address] [place], [place] [address]                                 DISCHARGE SUMMARY    PATIENT:   Judith Jenkins, Judith Jenkins  MRN:           [id number]    ADMIT DATE:    09/04/2013  BILLING:       [id number] [facility] DATE:     09/06/2013  ATTENDING: Cottie Banda, MD  DICTATING  Jonah Blue, MD      DISCHARGE DIAGNOSES: Include  1. Diarrhea at the time of admission. This was felt to be antibiotic  related. Clostridium difficile was negative.  2. Renal failure at the time of admission, which was showing improvement.  3. Hypokalemia.  4. Hypertension.  5. History of recent diverticulitis status post treatment with a course of  antibiotics.  6. Asymptomatic cholelithiasis.  7. Hypertension.    DISCHARGE MEDICATIONS: Included  1. Zocor 40 mg p.o. daily.  2. Florastor 500 mg p.o. twice daily for the next 7 days.    Check a CBC, CMP, magnesium in 4 days, results to PCP immediately. The  patient was advised to follow up with her primary care physician in 1 week.  The patient was advised to follow up with nephrologist in 2-3 weeks. The  patient was advised to follow up with Gastroenterology in 2-3 weeks. The  patient's blood pressure had remained stable off her home blood pressure  medications, so these were not continued at the time of discharge. I will  defer to the primary care physician regarding monitoring the blood pressure  as an outpatient.    HOSPITAL COURSE: This is a 73 year old female, presented to the ER with  complaints of cramping abdominal pain and nausea and diarrhea. The patient  recently had a bout of diverticulitis and was treated with a course of  antibiotics. The patient was admitted. Antibiotics were continued.  Gastroenterology was consulted. Stool for C difficile was checked, which  was negative. Initially, there was concern for some C diff, but   subsequently it was felt that the patient likely had antibiotic-associated  diarrhea. The patient was continued on IV fluids. Subsequently, antibiotics  were discontinued. The patient was started on probiotics. The patient was  monitored and supportive care was provided. The patient had some acute  kidney injury at the time of admission. This was felt to be secondary to  dehydration. This showed improvement with IV fluids. Nephrology was also  consulted. The patient showed improvement and was cleared for discharge by  Gastroenterology as well as Nephrology. The patient wished to go home.  Discharge plan was discussed with the patient at length. Total time for the  discharge was more than 35 minutes.                     Jonah Blue, MD    VT:wmx  D: 09/16/2013 T: 09/16/2013 10:49 P  Job: [id number]  CScriptDoc #: [id number]  cc:   Wyn Forster, MD        DENISE  Y. Remer Macho, MD        Jonah Blue, MD

## 2014-04-21 ENCOUNTER — Encounter

## 2014-04-24 ENCOUNTER — Inpatient Hospital Stay: Admit: 2014-04-24 | Payer: MEDICARE | Attending: Family Medicine | Primary: Family Medicine

## 2014-04-24 DIAGNOSIS — R9089 Other abnormal findings on diagnostic imaging of central nervous system: Secondary | ICD-10-CM

## 2014-04-24 LAB — CREATININE, POC
Creatinine, POC: 0.9 MG/DL (ref 0.6–1.3)
GFRAA, POC: >60 mL/min/{1.73_m2} (ref >60)
GFRNA, POC: >60 mL/min/{1.73_m2} (ref >60)

## 2014-04-24 MED ORDER — GADOPENTETATE DIMEGLUMINE 10 MMOL/20 ML (469.01 MG/ML) IV
10 mmol/20 mL (469.01 mg/mL) | Freq: Once | INTRAVENOUS | Status: AC
Start: 2014-04-24 — End: 2014-04-24
  Administered 2014-04-24: 15:00:00 via INTRAVENOUS

## 2014-04-24 MED FILL — MAGNEVIST 10 MMOL/20 ML (469.01 MG/ML) INTRAVENOUS SOLUTION: 10 mmol/20 mL (469.01 mg/mL) | INTRAVENOUS | Qty: 13

## 2014-07-23 ENCOUNTER — Encounter

## 2014-07-23 LAB — HEPATIC FUNCTION PANEL
ALT: 18 U/L (ref 7–35)
AST: 17 U/L (ref 13–35)
Alkaline Phosphatase: 93 U/L (ref 25–125)
Bilirubin, Total: 0.6 mg/dL

## 2014-07-23 LAB — CBC AND DIFFERENTIAL
HCT: 40 % (ref 36–46)
Hemoglobin: 13.5 g/dL (ref 12.0–16.0)
Neutrophils Absolute: 3 /uL
Platelets: 223 K/µL (ref 150–399)
WBC: 3.6 10*3/mL

## 2014-07-23 LAB — LIPID PANEL
Cholesterol: 189 mg/dL (ref 0–200)
HDL: 76 mg/dL — AB (ref 35–70)
LDL CALC: 95 mg/dL
TRIGLYCERIDES: 92 mg/dL (ref 40–160)

## 2014-07-23 LAB — BASIC METABOLIC PANEL
BUN: 11 mg/dL (ref 4–21)
Creatinine: 0.8 mg/dL (ref 0.5–1.1)
Glucose: 96 mg/dL
Potassium: 4.1 mmol/L (ref 3.4–5.3)
SODIUM: 143 mmol/L (ref 137–147)

## 2014-08-01 ENCOUNTER — Inpatient Hospital Stay: Payer: MEDICARE | Attending: Family Medicine | Primary: Family Medicine

## 2014-11-04 ENCOUNTER — Inpatient Hospital Stay
Admit: 2014-11-04 | Discharge: 2014-11-04 | Disposition: A | Discharge status: Home / Self Care | Payer: MEDICARE | Attending: Emergency Medicine

## 2014-11-04 DIAGNOSIS — R112 Nausea with vomiting, unspecified: Secondary | ICD-10-CM

## 2014-11-04 LAB — CBC WITH AUTOMATED DIFF
ABS. BASOPHILS: 0 10*3/uL (ref 0.0–0.06)
ABS. EOSINOPHILS: 0 10*3/uL (ref 0.0–0.4)
ABS. LYMPHOCYTES: 0.6 10*3/uL — ABNORMAL LOW (ref 0.9–3.6)
ABS. MONOCYTES: 0.4 10*3/uL (ref 0.05–1.2)
ABS. NEUTROPHILS: 8.5 10*3/uL — ABNORMAL HIGH (ref 1.8–8.0)
BASOPHILS: 0 % (ref 0–2)
EOSINOPHILS: 0 % (ref 0–5)
HCT: 44.5 % (ref 35.0–45.0)
HGB: 15.1 g/dL (ref 12.0–16.0)
LYMPHOCYTES: 6 % — ABNORMAL LOW (ref 21–52)
MCH: 32.3 PG (ref 24.0–34.0)
MCHC: 33.9 g/dL (ref 31.0–37.0)
MCV: 95.1 FL (ref 74.0–97.0)
MONOCYTES: 4 % (ref 3–10)
MPV: 11 FL (ref 9.2–11.8)
NEUTROPHILS: 90 % — ABNORMAL HIGH (ref 40–73)
PLATELET: 222 10*3/uL (ref 135–420)
RBC: 4.68 M/uL (ref 4.20–5.30)
RDW: 13.8 % (ref 11.6–14.5)
WBC: 9.4 10*3/uL (ref 4.6–13.2)

## 2014-11-04 LAB — METABOLIC PANEL, COMPREHENSIVE
A-G Ratio: 1 (ref 0.8–1.7)
ALT (SGPT): 18 U/L (ref 13–56)
AST (SGOT): 15 U/L (ref 15–37)
Albumin: 3.9 g/dL (ref 3.4–5.0)
Alk. phosphatase: 113 U/L (ref 45–117)
Anion gap: 12 mmol/L (ref 3.0–18)
BUN/Creatinine ratio: 17 (ref 12–20)
BUN: 13 MG/DL (ref 7.0–18)
Bilirubin, total: 0.7 MG/DL (ref 0.2–1.0)
CO2: 26 mmol/L (ref 21–32)
Calcium: 9.5 MG/DL (ref 8.5–10.1)
Chloride: 103 mmol/L (ref 100–108)
Creatinine: 0.78 MG/DL (ref 0.6–1.3)
GFR est AA: >60 mL/min/{1.73_m2} (ref >60)
GFR est non-AA: >60 mL/min/{1.73_m2} (ref >60)
Globulin: 3.9 g/dL (ref 2.0–4.0)
Glucose: 140 mg/dL — ABNORMAL HIGH (ref 74–99)
Potassium: 3.2 mmol/L — ABNORMAL LOW (ref 3.5–5.5)
Protein, total: 7.8 g/dL (ref 6.4–8.2)
Sodium: 141 mmol/L (ref 136–145)

## 2014-11-04 LAB — URINALYSIS W/ RFLX MICROSCOPIC
Bilirubin: NEGATIVE
Blood: NEGATIVE
Glucose: NEGATIVE mg/dL
Nitrites: NEGATIVE
Protein: NEGATIVE mg/dL
Specific gravity: 1.013 (ref 1.005–1.030)
Urobilinogen: 0.2 EU/dL (ref 0.2–1.0)
pH (UA): 8.5 — ABNORMAL HIGH (ref 5.0–8.0)

## 2014-11-04 LAB — MAGNESIUM: Magnesium: 2.4 mg/dL (ref 1.8–2.4)

## 2014-11-04 LAB — CARDIAC PANEL,(CK, CKMB & TROPONIN)
CK - MB: 5.8 ng/ml — ABNORMAL HIGH (ref 0.5–3.6)
CK-MB Index: 4.7 % — ABNORMAL HIGH (ref 0.0–4.0)
CK: 124 U/L (ref 26–192)
Troponin-I, QT: <0.02 NG/ML (ref 0.0–0.045)

## 2014-11-04 LAB — URINE MICROSCOPIC ONLY
RBC: NEGATIVE /hpf (ref 0–5)
WBC: 0 /hpf (ref 0–4)

## 2014-11-04 MED ORDER — ONDANSETRON 8 MG TAB, RAPID DISSOLVE
8 mg | ORAL_TABLET | Freq: Three times a day (TID) | ORAL | 0 refills | Status: DC | PRN
Start: 2014-11-04 — End: 2015-09-19

## 2014-11-04 MED ORDER — FAMOTIDINE 20 MG TAB
20 mg | ORAL_TABLET | Freq: Two times a day (BID) | ORAL | 0 refills | Status: AC
Start: 2014-11-04 — End: 2014-11-14

## 2014-11-04 MED ORDER — SODIUM CHLORIDE 0.9 % IV
Freq: Once | INTRAVENOUS | Status: AC
Start: 2014-11-04 — End: 2014-11-04
  Administered 2014-11-04: 17:00:00 via INTRAVENOUS

## 2014-11-04 MED ORDER — FAMOTIDINE (PF) 20 MG/2 ML IV
20 mg/2 mL | INTRAVENOUS | Status: AC
Start: 2014-11-04 — End: 2014-11-04
  Administered 2014-11-04: 17:00:00 via INTRAVENOUS

## 2014-11-04 MED ORDER — ONDANSETRON (PF) 4 MG/2 ML INJECTION
4 mg/2 mL | INTRAMUSCULAR | Status: AC
Start: 2014-11-04 — End: 2014-11-04
  Administered 2014-11-04: 17:00:00 via INTRAVENOUS

## 2014-11-04 MED FILL — SODIUM CHLORIDE 0.9 % IV: INTRAVENOUS | Qty: 1000

## 2014-11-04 MED FILL — ONDANSETRON (PF) 4 MG/2 ML INJECTION: 4 mg/2 mL | INTRAMUSCULAR | Qty: 2

## 2014-11-04 MED FILL — FAMOTIDINE (PF) 20 MG/2 ML IV: 20 mg/2 mL | INTRAVENOUS | Qty: 2

## 2014-11-04 NOTE — ED Provider Notes (Addendum)
HPI Comments: 12:05 PM Judith Jenkins is a 74 y.o. female with h/o diverticulitis last year presenting to the ED c/o vomiting onset 9 AM this morning. Per power of attorney, she also appeared  SOB, and nausea.  No other complaints at this time.   Patient states she started feeling bad yesterday morning.  She ate [employer] the day before.  Daughter and son-in-law states she ate last night, steak and potatoes but admits she really was not hungry.   Symptoms of nausea and vomiting return early this morning.  Patient admits to diarrhea yesterday, none today.  Patient denies any pain but later reports upper abdominal discomfort.  Son-in-law states patient did not appear ill yesterday.  States she went to Dr. Ezra Sitesajao's office and received 2 new medications, unsure what they were.    Patient has moderate dementia and is poor and vague historian.  History is a bit contradicting what family is telling me and what patient is telling me.     PCP: Rise Faith Margaretmary EddyEspina Dajao, MD             No past medical history on file.    No past surgical history on file.      No family history on file.    Social History     Social History   ??? Marital status: MARRIED     Spouse name: N/A   ??? Number of children: N/A   ??? Years of education: N/A     Occupational History   ??? Not on file.     Social History Main Topics   ??? Smoking status: Not on file   ??? Smokeless tobacco: Not on file   ??? Alcohol use Not on file   ??? Drug use: Not on file   ??? Sexual activity: Not on file     Other Topics Concern   ??? Not on file     Social History Narrative   ??? No narrative on file         ALLERGIES: Sulfa (sulfonamide antibiotics)    Review of Systems   Constitutional: Positive for fatigue. Negative for activity change, appetite change, chills and fever.   HENT: Negative.    Eyes: Negative for visual disturbance.   Respiratory: Negative for cough and shortness of breath.    Cardiovascular: Negative.     Gastrointestinal: Positive for diarrhea, nausea and vomiting. Negative for abdominal distention and abdominal pain.   Genitourinary: Negative for difficulty urinating and dysuria.   Musculoskeletal: Negative.    Neurological: Negative.  Negative for headaches.   Psychiatric/Behavioral:        History of moderate dementia according to daughter.    All other systems reviewed and are negative.      Vitals:    11/04/14 1156   BP: 183/90   Pulse: 65   Resp: 18   SpO2: 100%   Weight: 56.7 kg (125 lb)   Height: 5' (1.524 m)            Physical Exam   Constitutional: She appears well-developed. She appears distressed.   Slight disheveled appearance.   HENT:   Mouth/Throat: Oropharynx is clear and moist.   Eyes: Conjunctivae and EOM are normal. Pupils are equal, round, and reactive to light. No scleral icterus.   Neck: Normal range of motion.   Cardiovascular: Normal rate, regular rhythm and normal heart sounds.    Pulmonary/Chest: Effort normal and breath sounds normal.   Abdominal: Soft. Bowel sounds are normal.  There is tenderness. There is no rebound and no guarding. No hernia.       Musculoskeletal: Normal range of motion.   Neurological: She is alert. No cranial nerve deficit.   Oriented to person and place   Skin: She is not diaphoretic.   Psychiatric:   Flat affect   Nursing note and vitals reviewed.       MDM  Number of Diagnoses or Management Options  Bilateral upper abdominal discomfort:   Non-intractable vomiting with nausea, unspecified vomiting type:   Diagnosis management comments: 74yo female with moderate dementia presenting to ER via private vehicle complaining of nausea and vomiting.  Symptoms started this am.  She denies abdominal pain but on exam she had mild diffuse upper abdominal TTP without peritoneal signs.  IVF, IV zofran and pepcid given here.  She has not had any vomiting since medication.  EKG without ischemic changes and troponin negative.  Labs reassuring.   Patient denies urinary complaints, urine sample appears contaminated.   No vomiting after PO challenge.  Patient states she feels better.     ED Course         EKG- SINUS RHYTHM WITH PAC'S, RATE 66, NORMAL AXIS.  NO STEMI.    Procedures    Scribe Attestation  [facility] scribing for and in the presence of Lawson Radar, [place] (11/04/14 / 12:48 PM)      Physician Attestation    I personally performed the services described in this documentation, reviewed and edited the documentation which was dictated to the scribe in my presence, and it accurately records my words and actions.    Lawson Radar, PA

## 2014-11-04 NOTE — ED Triage Notes (Signed)
Nausea vomiting since 9am. History of diverticulitis.

## 2014-11-04 NOTE — ED Notes (Signed)
I have reviewed discharge instructions with the patient.  The patient verbalized understanding. Patient discharged without removing armband and transfered to another healthcare acute, sub acute , or extended care facility.  Informed of privacy risks if armband lost or stolen

## 2014-11-04 NOTE — Progress Notes (Signed)
Care Management Interventions  PCP Verified by CM: Yes (Dr. Andrena Mewsajao)  Mode of Transport at Discharge: Other (see comment) (Patient's son-in-law, Judith Jenkins, is at bedside.)  Transition of Care Consult (CM Consult): Discharge Planning (Pt w/dementia diagnosis and difficulty caring for self per family.  Information provided on private duty nursing as patient is above the income threshold for Medicaid PCA.)  Current Support Network: Own Home, Family Lives [place], Lives with Caregiver (Pt lives in her own home.  Patient's daughter and son-in-law are currently living with her and assisting with her care.  However, family uncomfortable with certain aspects of care such as bathing and dressing.)  Confirm Follow Up Transport: Family (Son-in-law transports.  Pt claims she drives, but does not.  )  Plan discussed with Pt/Family/Caregiver: Yes    Patient is a 74 yo female who came to the ED c/o nausea/vomiting.  Patient's son-in-law, Judith Jenkins, is at bedside.  Patient's daughter, Judith Jenkins, is POA.  Patient has a diagnosis of dementia from Dr. Andrena Mewsajao.  Patient has not followed up with a neurologist recently.  Pt had an appointment but cancelled it unbeknownst to family.  Patient's appointment has since been rescheduled.     Pt with increasing difficulty regarding dementia.  Patient ambulates with a cane.  Patient does not bathe or dress per family.  Patient with increasingly aggressive behavior.  Patient requires supervision and prompting.  Patient states she can do things for herself, however judgment is poor.  Per son-in-law, prior to moving into the home, patient would go to [employer] and continue to drink out of the cups even after mold is present.  Patient would also do things like take chicken out, leave it on the counter for days, then attempt to prepare it.  Additionally, patient would also wrap a half-eaten hamburger in the wrapping from raw meat.   Patient also leaving soiled sheets on bed.  Per son, while staying in the home, they can assist with the meals, laundry, etc.  However, patient's son-in-law feels uncomfortable with assisting with bathing and dressing and daughter "can't handle it".  Patient does not drive, although will state she is able.  Dr[facility] And[facility].  Patient's income greater than $2200/mo therefore [facility] not qualify [facility].      SW provided the patient's son-in-law with requested information for private duty care.  Information regarding spectrum of care also provided (NH, memory care, HH, etc).  SW provided card with contact information.  SW encouraged follow up with neurology.

## 2014-11-05 LAB — EKG, 12 LEAD, INITIAL
Atrial Rate: 66 {beats}/min
Calculated P Axis: 84 degrees
Calculated R Axis: 8 degrees
Calculated T Axis: 11 degrees
P-R Interval: 184 ms
Q-T Interval: 426 ms
QRS Duration: 90 ms
QTC Calculation (Bezet): 446 ms
Ventricular Rate: 66 {beats}/min

## 2015-09-19 ENCOUNTER — Emergency Department: Admit: 2015-09-19 | Payer: MEDICARE | Primary: Family Medicine

## 2015-09-19 ENCOUNTER — Inpatient Hospital Stay
Admit: 2015-09-19 | Discharge: 2015-09-19 | Disposition: A | Discharge status: Home / Self Care | Payer: MEDICARE | Attending: Emergency Medicine

## 2015-09-19 DIAGNOSIS — Z041 Encounter for examination and observation following transport accident: Secondary | ICD-10-CM

## 2015-09-19 LAB — DRUG SCREEN, URINE
AMPHETAMINES: NEGATIVE
BARBITURATES: NEGATIVE
BENZODIAZEPINES: NEGATIVE
COCAINE: NEGATIVE
METHADONE: NEGATIVE
OPIATES: NEGATIVE
PCP(PHENCYCLIDINE): NEGATIVE
THC (TH-CANNABINOL): NEGATIVE

## 2015-09-19 LAB — CBC WITH AUTOMATED DIFF
ABS. BASOPHILS: 0 10*3/uL (ref 0.0–0.1)
ABS. EOSINOPHILS: 0 10*3/uL (ref 0.0–0.4)
ABS. LYMPHOCYTES: 0.8 10*3/uL — ABNORMAL LOW (ref 0.9–3.6)
ABS. MONOCYTES: 0.5 10*3/uL (ref 0.05–1.2)
ABS. NEUTROPHILS: 3.9 10*3/uL (ref 1.8–8.0)
BASOPHILS: 0 % (ref 0–2)
EOSINOPHILS: 1 % (ref 0–5)
HCT: 42.3 % (ref 35.0–45.0)
HGB: 14.5 g/dL (ref 12.0–16.0)
LYMPHOCYTES: 15 % — ABNORMAL LOW (ref 21–52)
MCH: 32 PG (ref 24.0–34.0)
MCHC: 34.3 g/dL (ref 31.0–37.0)
MCV: 93.4 FL (ref 74.0–97.0)
MONOCYTES: 9 % (ref 3–10)
MPV: 10.7 FL (ref 9.2–11.8)
NEUTROPHILS: 75 % — ABNORMAL HIGH (ref 40–73)
PLATELET: 206 10*3/uL (ref 135–420)
RBC: 4.53 M/uL (ref 4.20–5.30)
RDW: 13.2 % (ref 11.6–14.5)
WBC: 5.2 10*3/uL (ref 4.6–13.2)

## 2015-09-19 LAB — URINALYSIS W/ RFLX MICROSCOPIC
Bilirubin: NEGATIVE
Blood: NEGATIVE
Glucose: NEGATIVE mg/dL
Nitrites: NEGATIVE
Protein: NEGATIVE mg/dL
Specific gravity: 1.026 (ref 1.005–1.030)
Urobilinogen: 1 EU/dL (ref 0.2–1.0)
pH (UA): 6 (ref 5.0–8.0)

## 2015-09-19 LAB — URINE MICROSCOPIC ONLY
RBC: 0 /hpf (ref 0–5)
WBC: 4 /hpf (ref 0–4)

## 2015-09-19 LAB — METABOLIC PANEL, COMPREHENSIVE
A-G Ratio: 1 (ref 0.8–1.7)
ALT (SGPT): 19 U/L (ref 13–56)
AST (SGOT): 11 U/L — ABNORMAL LOW (ref 15–37)
Albumin: 3.4 g/dL (ref 3.4–5.0)
Alk. phosphatase: 94 U/L (ref 45–117)
Anion gap: 6 mmol/L (ref 3.0–18)
BUN/Creatinine ratio: 15 (ref 12–20)
BUN: 11 MG/DL (ref 7.0–18)
Bilirubin, total: 0.8 MG/DL (ref 0.2–1.0)
CO2: 30 mmol/L (ref 21–32)
Calcium: 9.6 MG/DL (ref 8.5–10.1)
Chloride: 105 mmol/L (ref 100–108)
Creatinine: 0.71 MG/DL (ref 0.6–1.3)
GFR est AA: >60 mL/min/{1.73_m2} (ref >60)
GFR est non-AA: >60 mL/min/{1.73_m2} (ref >60)
Globulin: 3.3 g/dL (ref 2.0–4.0)
Glucose: 89 mg/dL (ref 74–99)
Potassium: 3.5 mmol/L (ref 3.5–5.5)
Protein, total: 6.7 g/dL (ref 6.4–8.2)
Sodium: 141 mmol/L (ref 136–145)

## 2015-09-19 LAB — ETHYL ALCOHOL: ALCOHOL(ETHYL),SERUM: <3 MG/DL (ref 0–3)

## 2015-09-19 MED ORDER — DONEPEZIL 10 MG DISINTEGRATING TABLET
10 mg | ORAL_TABLET | Freq: Every day | ORAL | 0 refills | Status: AC
Start: 2015-09-19 — End: ?

## 2015-09-19 MED ORDER — HYDROCHLOROTHIAZIDE 25 MG TAB
25 mg | ORAL_TABLET | Freq: Every day | ORAL | 0 refills | Status: AC
Start: 2015-09-19 — End: 2015-10-19

## 2015-09-19 MED ORDER — LISINOPRIL 10 MG TAB
10 mg | ORAL_TABLET | Freq: Every day | ORAL | 0 refills | Status: AC
Start: 2015-09-19 — End: 2015-10-19

## 2015-09-19 MED ORDER — MEMANTINE 10 MG TAB
10 mg | ORAL_TABLET | Freq: Two times a day (BID) | ORAL | 0 refills | Status: AC
Start: 2015-09-19 — End: 2015-10-19

## 2015-09-19 MED ORDER — SIMVASTATIN 40 MG TAB
40 mg | ORAL_TABLET | Freq: Every evening | ORAL | 0 refills | Status: AC
Start: 2015-09-19 — End: 2015-10-19
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
## 2015-09-19 NOTE — ED Notes (Signed)
Bedside shift change report given to Rachel, RN (oncoming nurse) by Kameko, RN (offgoing nurse). Report included the following information SBAR, ED Summary, MAR and Recent Results.

## 2015-09-19 NOTE — ED Notes (Signed)
I have reviewed discharge instructions with the caregiver.  The caregiver verbalized understanding. Pt ambulated from ED, prescriptions and discharge paperwork in hand with daughter.

## 2015-09-19 NOTE — ED Notes (Signed)
The patient's ex-husband, Diamond Nickelhomas Bendorf can be reached at [phone] for any questions or concerns.
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
## 2015-09-19 NOTE — ED Notes (Signed)
Pt awake in bed with both side rails up and call button in reach. Pt is relaxed and comfortable. Pt in NAD at this time.

## 2015-09-19 NOTE — ED Notes (Signed)
Pt is up for discharge but has dementia, so I am waiting till her daughter in law comes back into ER to discharge pt.

## 2015-09-19 NOTE — ED Notes (Signed)
Pt relaxed and sitting in bed watching tv. Both side rails are up and pt in NAD.

## 2015-09-22 ENCOUNTER — Encounter

## 2015-09-24 ENCOUNTER — Inpatient Hospital Stay: Admit: 2015-09-24 | Payer: MEDICARE | Attending: Family Medicine | Primary: Family Medicine

## 2015-09-24 DIAGNOSIS — I69319 Unspecified symptoms and signs involving cognitive functions following cerebral infarction: Secondary | ICD-10-CM

## 2015-09-24 MED ORDER — GADOBUTROL 7.5 MMOL/7.5 ML (1 MMOL/ML) IV SYRINGE
7.5 mmol/ mL (1 mmol/mL) | Freq: Once | INTRAVENOUS | Status: AC
Start: 2015-09-24 — End: 2015-09-24
  Administered 2015-09-24: 16:00:00 via INTRAVENOUS

## 2015-09-24 MED FILL — GADAVIST 7.5 MMOL/7.5 ML (1 MMOL/ML) INTRAVENOUS SYRINGE: 7.5 mmol/ mL (1 mmol/mL) | INTRAVENOUS | Qty: 7.5

## 2015-10-28 ENCOUNTER — Telehealth: Payer: Self-pay

## 2015-10-28 NOTE — Telephone Encounter (Signed)
Medical records received from pt previous provider. Dr.Dajzo. Scheduled with Dr.Cook 10/29/15.

## 2015-10-29 ENCOUNTER — Encounter (INDEPENDENT_AMBULATORY_CARE_PROVIDER_SITE_OTHER): Payer: Self-pay

## 2015-10-29 ENCOUNTER — Encounter: Payer: Self-pay | Admitting: Family Medicine

## 2015-10-29 ENCOUNTER — Ambulatory Visit (INDEPENDENT_AMBULATORY_CARE_PROVIDER_SITE_OTHER): Payer: Medicare Other | Admitting: Family Medicine

## 2015-10-29 VITALS — BP 130/68 | HR 75 | Temp 98.0°F | Ht 61.0 in | Wt 126.4 lb

## 2015-10-29 DIAGNOSIS — E785 Hyperlipidemia, unspecified: Secondary | ICD-10-CM | POA: Diagnosis not present

## 2015-10-29 DIAGNOSIS — R350 Frequency of micturition: Secondary | ICD-10-CM | POA: Insufficient documentation

## 2015-10-29 DIAGNOSIS — E559 Vitamin D deficiency, unspecified: Secondary | ICD-10-CM | POA: Diagnosis not present

## 2015-10-29 DIAGNOSIS — Z86718 Personal history of other venous thrombosis and embolism: Secondary | ICD-10-CM | POA: Insufficient documentation

## 2015-10-29 DIAGNOSIS — F0390 Unspecified dementia without behavioral disturbance: Secondary | ICD-10-CM | POA: Insufficient documentation

## 2015-10-29 DIAGNOSIS — I69319 Unspecified symptoms and signs involving cognitive functions following cerebral infarction: Secondary | ICD-10-CM | POA: Insufficient documentation

## 2015-10-29 DIAGNOSIS — I10 Essential (primary) hypertension: Secondary | ICD-10-CM

## 2015-10-29 DIAGNOSIS — M8580 Other specified disorders of bone density and structure, unspecified site: Secondary | ICD-10-CM | POA: Insufficient documentation

## 2015-10-29 DIAGNOSIS — Z86711 Personal history of pulmonary embolism: Secondary | ICD-10-CM | POA: Insufficient documentation

## 2015-10-29 DIAGNOSIS — Z23 Encounter for immunization: Secondary | ICD-10-CM | POA: Diagnosis not present

## 2015-10-29 DIAGNOSIS — M1990 Unspecified osteoarthritis, unspecified site: Secondary | ICD-10-CM | POA: Insufficient documentation

## 2015-10-29 LAB — LIPID PANEL
CHOLESTEROL: 261 mg/dL — AB (ref 0–200)
HDL: 67.9 mg/dL (ref >39.00)
LDL CALC: 168 mg/dL — AB (ref 0–99)
NonHDL: 192.76
TRIGLYCERIDES: 125 mg/dL (ref 0.0–149.0)
Total CHOL/HDL Ratio: 4
VLDL: 25 mg/dL (ref 0.0–40.0)

## 2015-10-29 LAB — POCT URINALYSIS DIPSTICK
Bilirubin, UA: NEGATIVE
Glucose, UA: NEGATIVE
KETONES UA: NEGATIVE
LEUKOCYTES UA: NEGATIVE
NITRITE UA: NEGATIVE
PH UA: 6
PROTEIN UA: NEGATIVE
Spec Grav, UA: 1.01
UROBILINOGEN UA: 0.2

## 2015-10-29 LAB — COMPREHENSIVE METABOLIC PANEL
ALT: 21 U/L (ref 0–35)
AST: 19 U/L (ref 0–37)
Albumin: 4 g/dL (ref 3.5–5.2)
Alkaline Phosphatase: 102 U/L (ref 39–117)
BILIRUBIN TOTAL: 0.5 mg/dL (ref 0.2–1.2)
BUN: 14 mg/dL (ref 6–23)
CO2: 32 meq/L (ref 19–32)
CREATININE: 0.72 mg/dL (ref 0.40–1.20)
Calcium: 10.1 mg/dL (ref 8.4–10.5)
Chloride: 103 mEq/L (ref 96–112)
GFR: 83.48 mL/min (ref >60.00)
GLUCOSE: 106 mg/dL — AB (ref 70–99)
Potassium: 3.8 mEq/L (ref 3.5–5.1)
Sodium: 143 mEq/L (ref 135–145)
Total Protein: 7.3 g/dL (ref 6.0–8.3)

## 2015-10-29 LAB — CBC
HCT: 44.6 % (ref 36.0–46.0)
Hemoglobin: 15.2 g/dL — ABNORMAL HIGH (ref 12.0–15.0)
MCHC: 34.1 g/dL (ref 30.0–36.0)
MCV: 93.3 fl (ref 78.0–100.0)
PLATELETS: 257 10*3/uL (ref 150.0–400.0)
RBC: 4.78 Mil/uL (ref 3.87–5.11)
RDW: 14.1 % (ref 11.5–15.5)
WBC: 8.6 10*3/uL (ref 4.0–10.5)

## 2015-10-29 LAB — HEMOGLOBIN A1C: HEMOGLOBIN A1C: 4.9 % (ref 4.6–6.5)

## 2015-10-29 MED ORDER — QUETIAPINE FUMARATE 50 MG PO TABS: 50.0000 mg | ORAL_TABLET | Freq: Every day | ORAL | 0 refills | Status: DC

## 2015-10-29 MED ORDER — FLUCONAZOLE 150 MG PO TABS: 150.0000 mg | ORAL_TABLET | Freq: Once | ORAL | 1 refills | Status: AC

## 2015-10-29 NOTE — Progress Notes (Signed)
Pre visit review using our clinic review tool, if applicable. No additional management support is needed unless otherwise documented below in the visit note. 

## 2015-10-29 NOTE — Assessment & Plan Note (Signed)
New problem. UA with trace intact blood. Stop Keflex. Obtaining culture.

## 2015-10-29 NOTE — Assessment & Plan Note (Signed)
Unsure why patient is not on aspirin. Will obtain additional records.

## 2015-10-29 NOTE — Progress Notes (Signed)
Subjective:  Patient ID: Gabrielle Coleman, female    DOB: 09-Nov-1940  Age: 75 y.o. MRN: [id number]  CC: Establish care  HPI Gabrielle Coleman is a 75 y.o. female presents to the clinic today to establish care. Primary historian is patient's caregiver (granddaughter). Concerns are below.  Urinary frequency  Granddaughter reports last Thursday patient developed urinary frequency.  She reports associated increase in confusion.  In granddaughter gave her some leftover Keflex as she suspected UTI. She has been giving her 500 mg twice daily. She's been doing this for the past week.  She reports resolution of symptoms with treatment.  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  Granddaughter reports that since he's been living with her she is "anxious" in the evenings.  She often repeats "its time to go home".  Is concerned about this and would like something to help her "anxiety".  HTN  Stable on lisinopril/HCTZ & norvasc.  HLD  Unsure of control.  No recent labs available.   Has had difficulty with statins.  Stroke  Patient has had several infarcts.  She is not currently on anticoagulation or aspirin.  I am unsure why.  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 Her granddaughter states that this was stopped after her most recent ED visit (she states that she had a hemorrhagic stroke. Her most recent MRI does not reveal this).  PMH, Surgical Hx, Family Hx, Social History reviewed and updated as below.  Past Medical History:  Diagnosis Date  . Dementia   . History of pulmonary embolus (PE)   . HTN (hypertension)   . Hyperlipidemia   . 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 Osteoarthritis   . Osteopenia   . Vitamin D deficiency    Past Surgical History:  Procedure Laterality Date  . APPENDECTOMY    . DILATION AND CURETTAGE OF UTERUS    . FEMORAL HERNIA REPAIR Right   . LUMBAR FUSION     L4-L5    . TOTAL HIP ARTHROPLASTY Right   . TUBAL LIGATION     Family History  Problem Relation Age of Onset  . Lung cancer Mother   . Hypertension Mother   . Heart disease Mother   . Prostate cancer Father   . Stroke Father   . Diabetes Mellitus II Sister   . Diabetes Mellitus II Brother   . Heart disease Brother   . Hypertension Son   . Liver disease Daughter    Social History  Substance Use Topics  . Smoking status: Never Smoker  . Smokeless tobacco: Never Used  . Alcohol use No    Review of Systems  Genitourinary: Positive for frequency.  Psychiatric/Behavioral: Positive for behavioral problems and confusion. The patient is not nervous/anxious.   All other systems reviewed and are negative.   Objective:   Today's Vitals: BP 130/68 (BP Location: Right Arm, Patient Position: Sitting, Cuff Size: Normal)   Pulse 75   Temp 98 F (36.7 C) (Oral)   Ht 5' 1"  (1.549 m)   Wt 126 lb 6 oz (57.3 kg)   SpO2 97%   BMI 23.88 kg/m   Physical Exam  Constitutional: She appears well-developed. No distress.  HENT:  Head: Normocephalic and atraumatic.  Mouth/Throat: Oropharynx is clear and moist.  R TM with small perforation (7 o'clock position).  Eyes: Conjunctivae are normal.  Neck: Neck supple.  Cardiovascular: Normal rate.  A regularly irregular rhythm present.  Appears  to be from Ectopy.  Pulmonary/Chest: Effort normal. She has no wheezes. She has no rales.  Abdominal: Soft. She exhibits no distension. There is no tenderness. There is no rebound and no guarding.  Neurological: She is alert.  Skin: No rash noted.  Psychiatric:  Flat affect.  Vitals reviewed.  Assessment & Plan:   Problem List Items Addressed This Visit    Vitamin D deficiency   Relevant Orders   Vitamin D (25 hydroxy)   Urinary frequency - Primary    New problem. UA with trace intact blood. Stop Keflex. Obtaining culture.      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 Will obtain additional records.       Hyperlipidemia    Unsure of control. Lipid panel today.       Relevant Medications   amLODipine (NORVASC) 2.5 MG tablet   hydrochlorothiazide (HYDRODIURIL) 25 MG tablet   lisinopril (PRINIVIL,ZESTRIL) 10 MG tablet   Other Relevant Orders   Lipid Profile   Essential hypertension    Stable. Continue Lisinopril, HCTZ and norvasc.       Relevant Medications   amLODipine (NORVASC) 2.5 MG tablet   hydrochlorothiazide (HYDRODIURIL) 25 MG tablet   lisinopril (PRINIVIL,ZESTRIL) 10 MG tablet   Other Relevant Orders   Comp Met (CMET)   HgB A1c   Dementia    Granddaughter's concerned about "anxiety". What she is experiencing is Sundowning in setting of dementia. Seroquel QHS.      Relevant Medications   memantine (NAMENDA) 10 MG tablet   donepezil (ARICEPT) 10 MG tablet   QUEtiapine (SEROQUEL) 50 MG tablet   Other Relevant Orders   CBC     Encounter for immunization       Relevant Orders   Flu vaccine HIGH DOSE PF (Completed)      Outpatient Encounter Prescriptions as of 10/29/2015  Medication Sig  . amLODipine (NORVASC) 2.5 MG tablet Take 2.5 mg by mouth daily.  Marland Kitchen CRANBERRY PO Take 4,200 mg by mouth. With vitamin C  . donepezil (ARICEPT) 10 MG tablet Take 10 mg by mouth daily.  . Flaxseed, Linseed, (FLAXSEED OIL) 1200 MG CAPS Take by mouth. With omega 3 fatty acids.  . Glucos-Chondroit-Hyaluron-MSM (GLUCOSAMINE CHONDROITIN JOINT PO) Take 2,000 mg by mouth.  . hydrochlorothiazide (HYDRODIURIL) 25 MG tablet Take 25 mg by mouth daily. Take 1/2 tablet.  Marland Kitchen lisinopril (PRINIVIL,ZESTRIL) 10 MG tablet Take 10 mg by mouth daily. Take 2 tablets.  . memantine (NAMENDA) 10 MG tablet Take 10 mg by mouth 2 (two) times daily.  . fluconazole (DIFLUCAN) 150 MG tablet Take 1 tablet (150 mg total) by mouth once. Repeat dosing in 72 hours.  Marland Kitchen QUEtiapine  (SEROQUEL) 50 MG tablet Take 1 tablet (50 mg total) by mouth at bedtime.   No facility-administered encounter medications on file as of 10/29/2015.     Follow-up: Return in about 3 months (around 01/29/2016).  [place]

## 2015-10-29 NOTE — Patient Instructions (Addendum)
Continue the medications.  I have added Seroquel to be used at night.  We will call regarding the results of the urine culture. Stop Keflex.  Follow up in 3 months.  Take care  Dr. Adriana Simasook

## 2015-10-29 NOTE — Assessment & Plan Note (Signed)
Stable. Continue Lisinopril, HCTZ and norvasc.

## 2015-10-29 NOTE — Assessment & Plan Note (Signed)
Unsure of control.  Lipid panel today. ?

## 2015-10-29 NOTE — Assessment & Plan Note (Signed)
Granddaughter's concerned about "anxiety". What she is experiencing is Sundowning in setting of dementia. Seroquel QHS.

## 2015-10-30 LAB — URINE CULTURE: Organism ID, Bacteria: NO GROWTH

## 2015-11-02 LAB — VITAMIN D 25 HYDROXY (VIT D DEFICIENCY, FRACTURES): VITD: 23.23 ng/mL — ABNORMAL LOW (ref 30.00–100.00)

## 2015-11-13 ENCOUNTER — Telehealth: Payer: Self-pay | Admitting: *Deleted

## 2015-11-13 NOTE — Telephone Encounter (Signed)
Pt granddaughter requested a call in reference to medications  Contact Joni Reiningicole [phone]

## 2015-11-16 NOTE — Telephone Encounter (Signed)
Left voicemail to callback please clarify the medication she is asking about.

## 2015-11-17 ENCOUNTER — Other Ambulatory Visit: Payer: Self-pay | Admitting: Family Medicine

## 2015-11-17 MED ORDER — ATORVASTATIN CALCIUM 40 MG PO TABS: 40.0000 mg | ORAL_TABLET | Freq: Every day | ORAL | 3 refills | Status: AC

## 2015-11-17 MED ORDER — DOCUSATE SODIUM 100 MG PO CAPS: 100.0000 mg | ORAL_CAPSULE | Freq: Two times a day (BID) | ORAL | 3 refills | Status: DC

## 2015-11-17 NOTE — Telephone Encounter (Signed)
She was told than her grandmother will be on a new cholesterol medication, however she was not given a name of the medication. She also questioned If pt can take blood thinners.  Contact Joni Reiningicole [phone]    a message can be left on voicemail

## 2015-11-17 NOTE — Telephone Encounter (Signed)
A voicemail was left for pt granddaughter to let her know to come by and have a medical record release signed along with the Emergency room that stopped pts blood thinners.

## 2015-11-17 NOTE — Telephone Encounter (Signed)
Pts granddaughter is okay with pt being placed on lipitor and would like for a stool softner to be prescribed has well.

## 2015-11-18 ENCOUNTER — Other Ambulatory Visit: Payer: Self-pay | Admitting: Family Medicine

## 2015-11-18 ENCOUNTER — Ambulatory Visit: Payer: Federal, State, Local not specified - PPO | Admitting: Family Medicine

## 2015-11-19 ENCOUNTER — Ambulatory Visit (INDEPENDENT_AMBULATORY_CARE_PROVIDER_SITE_OTHER): Payer: Medicare Other | Admitting: Family Medicine

## 2015-11-19 ENCOUNTER — Telehealth: Payer: Self-pay | Admitting: Family Medicine

## 2015-11-19 ENCOUNTER — Encounter: Payer: Self-pay | Admitting: Family Medicine

## 2015-11-19 VITALS — BP 153/84 | HR 78 | Temp 98.2°F | Resp 14 | Wt 125.1 lb

## 2015-11-19 DIAGNOSIS — R32 Unspecified urinary incontinence: Secondary | ICD-10-CM | POA: Diagnosis not present

## 2015-11-19 DIAGNOSIS — F0390 Unspecified dementia without behavioral disturbance: Secondary | ICD-10-CM

## 2015-11-19 LAB — POCT URINALYSIS DIPSTICK
BILIRUBIN UA: NEGATIVE
Glucose, UA: NEGATIVE
Ketones, UA: NEGATIVE
Leukocytes, UA: NEGATIVE
NITRITE UA: NEGATIVE
PH UA: 6.5
Protein, UA: NEGATIVE
RBC UA: NEGATIVE
Spec Grav, UA: 1.015
UROBILINOGEN UA: 0.2

## 2015-11-19 NOTE — Progress Notes (Signed)
   Subjective:  Patient ID: Gabrielle Coleman, female    DOB: [birth year]  Age: 75 y.o. MRN: [id number]  CC: ? UTI  HPI:  75 year old female with history of CVA and dementia (likely vascular dementia) presents for evaluation regarding possible UTI.  Primary historian is patient's caregiver. Per the caregiver, patient has endorsed some abdominal discomfort recently. She's also been more confused lately. She's had one episode of urinary incontinence. Her granddaughter thought that she may have a UTI and thus brought her in for evaluation. Patient denies any symptoms at this time. Appears well. She has no complaints at this time. Caregiver states that they tried Seroquel (at night for sundowngin) and it made her too sedated.  Social Hx   Social History   Social History  . Marital status: Divorced    Spouse name: N/A  . Number of children: N/A  . Years of education: N/A   Social History Main Topics  . Smoking status: Never Smoker  . Smokeless tobacco: Never Used  . Alcohol use No  . Drug use: No  . Sexual activity: No   Other Topics Concern  . None   Social History Narrative  . None    Review of Systems  Constitutional: Negative.   Genitourinary:       1 episode of incontinence.    Objective:  BP (!) 153/84 (BP Location: Left Arm, Patient Position: Sitting, Cuff Size: Normal)   Pulse 78   Temp 98.2 F (36.8 C) (Oral)   Resp 14   Wt 125 lb 2 oz (56.8 kg)   SpO2 98%   BMI 23.64 kg/m   BP/Weight 11/19/2015 10/29/2015  Systolic BP 153 130  Diastolic BP 84 68  Wt. (Lbs) 125.13 126.38  BMI 23.64 23.88   Physical Exam  Constitutional: She appears well-developed. No distress.  Pulmonary/Chest: Effort normal.  Neurological: She is alert.  Not oriented to person, place or time.  Psychiatric: She has a normal mood and affect.  Vitals reviewed.  Lab Results  Component Value Date   WBC 8.6 10/29/2015   HGB 15.2 (H) 10/29/2015   HCT 44.6 10/29/2015   PLT 257.0  10/29/2015   GLUCOSE 106 (H) 10/29/2015   CHOL 261 (H) 10/29/2015   TRIG 125.0 10/29/2015   HDL 67.90 10/29/2015   LDLCALC 168 (H) 10/29/2015   ALT 21 10/29/2015   AST 19 10/29/2015   NA 143 10/29/2015   K 3.8 10/29/2015   CL 103 10/29/2015   CREATININE 0.72 10/29/2015   BUN 14 10/29/2015   CO2 32 10/29/2015   HGBA1C 4.9 10/29/2015    Assessment & Plan:   Problem List Items Addressed This Visit    Urinary incontinence - Primary    Reported complaints of abdominal pain. Patient is asymptomatic today. Has had one episode of urinary incontinence and is reportedly more irritable/confused. Urinalysis normal. No evidence of UTI. Advised supportive care.      Relevant Orders   POCT Urinalysis Dipstick (Completed)   Urine culture     Follow-up: As scheduled  Everlene OtherJayce Oluwatomiwa Kinyon DO [facility] [place] [facility]

## 2015-11-19 NOTE — Telephone Encounter (Signed)
Per PR pts granddaughter was called to come by the office to get a urine cup and have pt give a specimen for early morning.

## 2015-11-19 NOTE — Telephone Encounter (Signed)
Noted. I have placed order for urine culture.

## 2015-11-19 NOTE — Assessment & Plan Note (Signed)
Reported complaints of abdominal pain. Patient is asymptomatic today. Has had one episode of urinary incontinence and is reportedly more irritable/confused. Urinalysis normal. No evidence of UTI. Advised supportive care.

## 2015-11-19 NOTE — Telephone Encounter (Signed)
Pt granddaughter called back and was wondering if she can use her own sterile cup, and how long after the urine sample is given does it need to be dropped off. Please advise, thank you!  Call AnthonyNicole @ [phone]

## 2015-11-19 NOTE — Patient Instructions (Addendum)
No evidence of UTI. Sending culture.   Exam was unremarkable other than her difficulties with memory/orientation.  Stop Seroquel.  Follow up as scheduled.  Take care  Dr. Adriana Simasook

## 2015-11-19 NOTE — Addendum Note (Signed)
Addended by: Felix AhmadiFRANSEN, Nichlos Kunzler A on: 11/19/2015 03:23 PM   Modules accepted: Orders

## 2015-11-19 NOTE — Telephone Encounter (Signed)
Pt granddaughter will drop off a urine in the morning so that it can be sent for culture.

## 2015-11-19 NOTE — Progress Notes (Signed)
Pre visit review using our clinic review tool, if applicable. No additional management support is needed unless otherwise documented below in the visit note. 

## 2015-11-20 ENCOUNTER — Other Ambulatory Visit: Payer: Medicare Other

## 2015-11-20 DIAGNOSIS — R32 Unspecified urinary incontinence: Secondary | ICD-10-CM

## 2015-11-21 LAB — URINE CULTURE

## 2015-12-09 ENCOUNTER — Ambulatory Visit (INDEPENDENT_AMBULATORY_CARE_PROVIDER_SITE_OTHER): Payer: Medicare Other | Admitting: Family Medicine

## 2015-12-09 ENCOUNTER — Encounter: Payer: Self-pay | Admitting: Family Medicine

## 2015-12-09 DIAGNOSIS — R0781 Pleurodynia: Secondary | ICD-10-CM | POA: Diagnosis not present

## 2015-12-09 NOTE — Assessment & Plan Note (Signed)
New problem. Exam unremarkable. Suspect strain.  Treating with supportive care and PRN Tylenol, Ibuprofen.

## 2015-12-09 NOTE — Progress Notes (Signed)
   Subjective:  Patient ID: Gabrielle Coleman, female    DOB: [birth year]  Age: 75 y.o. MRN: [id number]  CC: Fall, rib pain.   HPI:  75 year old female with vascular dementia, hypertension, history of stroke, osteopenia presents with the above complaint.  History obtained primarily from the caregiver as she has significant dementia.  Caregiver states that on 11/29 patient fell out of bed. She landed on her side. Since that time she's had intermittent complaints of rib pain, particularly on the right side. Appears to be worse with range of motion/activity. No medications or interventions tried. No known relieving factors. No shortness of breath. No fever. No other associated symptoms. No other complaints this time.  Social Hx   Social History   Social History  . Marital status: Divorced    Spouse name: N/A  . Number of children: N/A  . Years of education: N/A   Social History Main Topics  . Smoking status: Never Smoker  . Smokeless tobacco: Never Used  . Alcohol use No  . Drug use: No  . Sexual activity: No   Other Topics Concern  . None   Social History Narrative  . None   Review of Systems  Constitutional: Negative.   Respiratory: Negative for shortness of breath.   Musculoskeletal:       Rib pain, right.   Objective:  BP (!) 145/62 (BP Location: Left Arm, Patient Position: Sitting, Cuff Size: Normal)   Pulse 76   Temp 97.7 F (36.5 C) (Oral)   Resp 12   SpO2 99%   BP/Weight 12/09/2015 11/19/2015 10/29/2015  Systolic BP 145 153 130  Diastolic BP 62 84 68  Wt. (Lbs) - 125.13 126.38  BMI - 23.64 23.88   Physical Exam  Constitutional: She appears well-developed. No distress.  Cardiovascular: Normal rate and regular rhythm.   Pulmonary/Chest: Effort normal and breath sounds normal.  Musculoskeletal:  No discrete areas of rib tenderness.  Neurological: She is alert.  Psychiatric: She has a normal mood and affect.  Vitals reviewed.  Lab Results  Component  Value Date   WBC 8.6 10/29/2015   HGB 15.2 (H) 10/29/2015   HCT 44.6 10/29/2015   PLT 257.0 10/29/2015   GLUCOSE 106 (H) 10/29/2015   CHOL 261 (H) 10/29/2015   TRIG 125.0 10/29/2015   HDL 67.90 10/29/2015   LDLCALC 168 (H) 10/29/2015   ALT 21 10/29/2015   AST 19 10/29/2015   NA 143 10/29/2015   K 3.8 10/29/2015   CL 103 10/29/2015   CREATININE 0.72 10/29/2015   BUN 14 10/29/2015   CO2 32 10/29/2015   HGBA1C 4.9 10/29/2015   Assessment & Plan:   Problem List Items Addressed This Visit    Rib pain on right side    New problem. Exam unremarkable. Suspect strain.  Treating with supportive care and PRN Tylenol, Ibuprofen.        Follow-up: PRN  Everlene OtherJayce Libby Goehring DO [facility] [place]

## 2015-12-09 NOTE — Patient Instructions (Signed)
This is likely muscular from the fall.  Could be a rib fracture. Treatment is supportive and centers on pain control.  Tylenol 1000 mg three times daily as needed.   She can take ibuprofen briefly if needed as well (800 mg 2-3 times daily).  This will slowly improve.  Take care  Dr. Adriana Simasook

## 2016-02-01 ENCOUNTER — Ambulatory Visit: Payer: Federal, State, Local not specified - PPO | Admitting: Family Medicine

## 2016-03-18 ENCOUNTER — Encounter: Payer: Self-pay | Admitting: Family Medicine

## 2016-03-18 ENCOUNTER — Ambulatory Visit (INDEPENDENT_AMBULATORY_CARE_PROVIDER_SITE_OTHER): Payer: Medicare Other | Admitting: Family Medicine

## 2016-03-18 VITALS — BP 125/75 | HR 69 | Temp 98.0°F | Wt 123.5 lb

## 2016-03-18 DIAGNOSIS — I10 Essential (primary) hypertension: Secondary | ICD-10-CM | POA: Diagnosis not present

## 2016-03-18 DIAGNOSIS — D582 Other hemoglobinopathies: Secondary | ICD-10-CM

## 2016-03-18 DIAGNOSIS — R05 Cough: Secondary | ICD-10-CM | POA: Insufficient documentation

## 2016-03-18 DIAGNOSIS — I69319 Unspecified symptoms and signs involving cognitive functions following cerebral infarction: Secondary | ICD-10-CM

## 2016-03-18 DIAGNOSIS — F0390 Unspecified dementia without behavioral disturbance: Secondary | ICD-10-CM

## 2016-03-18 DIAGNOSIS — E785 Hyperlipidemia, unspecified: Secondary | ICD-10-CM | POA: Diagnosis not present

## 2016-03-18 DIAGNOSIS — Z87898 Personal history of other specified conditions: Secondary | ICD-10-CM

## 2016-03-18 DIAGNOSIS — R059 Cough, unspecified: Secondary | ICD-10-CM

## 2016-03-18 LAB — COMPREHENSIVE METABOLIC PANEL
ALBUMIN: 3.6 g/dL (ref 3.5–5.2)
ALT: 23 U/L (ref 0–35)
AST: 20 U/L (ref 0–37)
Alkaline Phosphatase: 113 U/L (ref 39–117)
BUN: 19 mg/dL (ref 6–23)
CALCIUM: 9.7 mg/dL (ref 8.4–10.5)
CO2: 29 mEq/L (ref 19–32)
CREATININE: 0.75 mg/dL (ref 0.40–1.20)
Chloride: 103 mEq/L (ref 96–112)
GFR: 79.56 mL/min (ref >60.00)
Glucose, Bld: 78 mg/dL (ref 70–99)
Potassium: 3.5 mEq/L (ref 3.5–5.1)
Sodium: 137 mEq/L (ref 135–145)
Total Bilirubin: 0.5 mg/dL (ref 0.2–1.2)
Total Protein: 6.3 g/dL (ref 6.0–8.3)

## 2016-03-18 LAB — LIPID PANEL
CHOL/HDL RATIO: 3
CHOLESTEROL: 148 mg/dL (ref 0–200)
HDL: 55.7 mg/dL (ref >39.00)
LDL Cholesterol: 76 mg/dL (ref 0–99)
NonHDL: 92.22
TRIGLYCERIDES: 81 mg/dL (ref 0.0–149.0)
VLDL: 16.2 mg/dL (ref 0.0–40.0)

## 2016-03-18 LAB — CBC
HCT: 41.9 % (ref 36.0–46.0)
Hemoglobin: 13.9 g/dL (ref 12.0–15.0)
MCHC: 33.2 g/dL (ref 30.0–36.0)
MCV: 95.5 fl (ref 78.0–100.0)
PLATELETS: 274 10*3/uL (ref 150.0–400.0)
RBC: 4.39 Mil/uL (ref 3.87–5.11)
RDW: 13.7 % (ref 11.5–15.5)
WBC: 7.4 10*3/uL (ref 4.0–10.5)

## 2016-03-18 LAB — POCT URINALYSIS DIPSTICK
Bilirubin, UA: NEGATIVE
GLUCOSE UA: NEGATIVE
Ketones, UA: NEGATIVE
Leukocytes, UA: NEGATIVE
NITRITE UA: NEGATIVE
PROTEIN UA: NEGATIVE
RBC UA: NEGATIVE
Spec Grav, UA: 1.015 (ref 1.030–1.035)
UROBILINOGEN UA: 0.2 (ref ?–2.0)
pH, UA: 5.5 (ref 5.0–8.0)

## 2016-03-18 NOTE — Progress Notes (Signed)
Pre visit review using our clinic review tool, if applicable. No additional management support is needed unless otherwise documented below in the visit note. 

## 2016-03-18 NOTE — Patient Instructions (Addendum)
Please have her take a daily aspirin 81 mg.  Continue her other medications.  No need for INR as she is not on anticoagulation. We will call with her lab results including her urine studies.  Lungs were clear. No need to worry about the cough.  Follow up in 6 months.  Take care   Dr. Adriana Simasook

## 2016-03-18 NOTE — Assessment & Plan Note (Signed)
Normal exam. No indication for further workup at this time.

## 2016-03-18 NOTE — Assessment & Plan Note (Signed)
Stable. Continue Aricept and Namenda.

## 2016-03-18 NOTE — Assessment & Plan Note (Signed)
Lipid panel today. Continue Lipitor. 

## 2016-03-18 NOTE — Progress Notes (Signed)
Subjective:  Patient ID: Gabrielle Coleman, female    DOB: 1940-06-07  Age: 76 y.o. MRN: [id number]  CC: Follow up  HPI:  76 year old female with dementia, prior CVA, hypertension, hyperlipidemia presents for follow-up. Patient accompanied by her daytime caregiver today.  Dementia  Stable per caregiver.  Compliant with Aricept and Namenda.  Hypertension  Stable on lisinopril and Norvasc.  Hyperlipidemia  Compliant with Lipitor.  Needs lipid panel today.  Cough  Granddaughter sent a piece paper stating that she's had a cough for 6 weeks.  Daytime caregiver states that she has not heard a cough.  Patient not endorsing cough.  No reports of chest pain or shortness of breath.  She is overall well appearing   Social Hx   Social History   Social History  . Marital status: Divorced    Spouse name: N/A  . Number of children: N/A  . Years of education: N/A   Social History Main Topics  . Smoking status: Never Smoker  . Smokeless tobacco: Never Used  . Alcohol use No  . Drug use: No  . Sexual activity: No   Other Topics Concern  . None   Social History Narrative  . None    Review of Systems  Constitutional: Negative.   Respiratory:       Cough reported by Granddaughter (with her at night).   Objective:  BP 125/75   Pulse 69   Temp 98 F (36.7 C) (Oral)   Wt 123 lb 8 oz (56 kg)   SpO2 96%   BMI 23.34 kg/m   BP/Weight 03/18/2016 12/09/2015 11/19/2015  Systolic BP 125 145 153  Diastolic BP 75 62 84  Wt. (Lbs) 123.5 - 125.13  BMI 23.34 - 23.64    Physical Exam  Constitutional: She appears well-developed. No distress.  Cardiovascular: Normal rate and regular rhythm.   Pulmonary/Chest: Effort normal and breath sounds normal.  Neurological: She is alert.  Psychiatric: She has a normal mood and affect.  Vitals reviewed.  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      Hyperlipidemia    Lipid panel today. Continue Lipitor.      Relevant Orders   Lipid panel   Essential hypertension    At goal. Continue lisinopril and Norvasc.      Relevant Orders   Comprehensive metabolic panel   Dementia - Primary    Stable. Continue Aricept and Namenda.      Cough    Normal exam. No indication for further workup at this time.       Other Visit Diagnoses    History of urinary incontinence       Relevant Orders   POCT Urinalysis Dipstick (Completed)   Elevated hemoglobin (HCC)       Relevant  Orders   CBC      Meds ordered this encounter  Medications  . OVER THE COUNTER MEDICATION    Sig: 500 mg. Cranberry and a probiotic capsule.  . Zinc 50 MG CAPS    Sig: Take by mouth.    Follow-up: Return in about 6 months (around 09/18/2016) for Follow up Chronic medical issues.  Everlene Other DO [facility]

## 2016-03-18 NOTE — Assessment & Plan Note (Signed)
Advised daily aspirin.

## 2016-03-18 NOTE — Assessment & Plan Note (Signed)
At goal.  Continue lisinopril and Norvasc. 

## 2016-04-29 ENCOUNTER — Telehealth: Payer: Self-pay | Admitting: Family Medicine

## 2016-04-29 NOTE — Telephone Encounter (Signed)
Pt's granddaughter (nicole) lvm stating that she is looking into placing her grandmother into memory care. She said that the facility is requiring her to have a SL2 form filled out. She wants to know if this is something that she needs to make an appointment for her grandmother to come in for or can she drop off the form for Dr. Adriana Simas? Joni Reining [phone].

## 2016-04-29 NOTE — Telephone Encounter (Signed)
pts granddaughter was called and told we would fill out and fax to [facility] at [phone]

## 2016-05-02 NOTE — Telephone Encounter (Signed)
Form was placed in red forms folder on PCP's desk.

## 2016-05-03 NOTE — Telephone Encounter (Signed)
Done

## 2016-05-03 NOTE — Telephone Encounter (Signed)
Information faxed and Gabrielle Coleman was called to let her know it was faxed.

## 2016-06-02 ENCOUNTER — Telehealth: Payer: Self-pay | Admitting: Family Medicine

## 2016-06-02 NOTE — Telephone Encounter (Signed)
[facility] at [phone]

## 2016-06-02 NOTE — Telephone Encounter (Signed)
[name] 336 [phone] called from [facility] regarding pt allergies for her medications they need something to go into her file. Fax number [phone]. Thank you!

## 2016-09-20 ENCOUNTER — Ambulatory Visit: Payer: Federal, State, Local not specified - PPO | Admitting: Family Medicine

## 2017-02-18 ENCOUNTER — Other Ambulatory Visit: Payer: Self-pay

## 2017-02-18 ENCOUNTER — Emergency Department
Admission: EM | Admit: 2017-02-18 | Discharge: 2017-02-18 | Disposition: A | Discharge status: Home / Self Care | Payer: Medicare Other | Attending: Emergency Medicine | Admitting: Emergency Medicine

## 2017-02-18 ENCOUNTER — Encounter: Payer: Self-pay | Admitting: Emergency Medicine

## 2017-02-18 ENCOUNTER — Emergency Department: Payer: Medicare Other

## 2017-02-18 DIAGNOSIS — I10 Essential (primary) hypertension: Secondary | ICD-10-CM | POA: Insufficient documentation

## 2017-02-18 DIAGNOSIS — R55 Syncope and collapse: Secondary | ICD-10-CM

## 2017-02-18 DIAGNOSIS — R4182 Altered mental status, unspecified: Secondary | ICD-10-CM | POA: Diagnosis present

## 2017-02-18 DIAGNOSIS — F0390 Unspecified dementia without behavioral disturbance: Secondary | ICD-10-CM | POA: Diagnosis not present

## 2017-02-18 DIAGNOSIS — Z79899 Other long term (current) drug therapy: Secondary | ICD-10-CM | POA: Diagnosis not present

## 2017-02-18 LAB — COMPREHENSIVE METABOLIC PANEL
ALBUMIN: 3.4 g/dL — AB (ref 3.5–5.0)
ALK PHOS: 95 U/L (ref 38–126)
ALT: 23 U/L (ref 14–54)
AST: 30 U/L (ref 15–41)
Anion gap: 10 (ref 5–15)
BILIRUBIN TOTAL: 0.9 mg/dL (ref 0.3–1.2)
BUN: 12 mg/dL (ref 6–20)
CO2: 25 mmol/L (ref 22–32)
Calcium: 9.1 mg/dL (ref 8.9–10.3)
Chloride: 108 mmol/L (ref 101–111)
Creatinine, Ser: 0.87 mg/dL (ref 0.44–1.00)
GFR calc Af Amer: >60 mL/min (ref >60)
GFR calc non Af Amer: >60 mL/min (ref >60)
GLUCOSE: 135 mg/dL — AB (ref 65–99)
POTASSIUM: 3.5 mmol/L (ref 3.5–5.1)
Sodium: 143 mmol/L (ref 135–145)
TOTAL PROTEIN: 6.5 g/dL (ref 6.5–8.1)

## 2017-02-18 LAB — CBC
HEMATOCRIT: 41.4 % (ref 35.0–47.0)
Hemoglobin: 13.8 g/dL (ref 12.0–16.0)
MCH: 32.5 pg (ref 26.0–34.0)
MCHC: 33.4 g/dL (ref 32.0–36.0)
MCV: 97.5 fL (ref 80.0–100.0)
Platelets: 202 10*3/uL (ref 150–440)
RBC: 4.24 MIL/uL (ref 3.80–5.20)
RDW: 14.6 % — AB (ref 11.5–14.5)
WBC: 6.8 10*3/uL (ref 3.6–11.0)

## 2017-02-18 LAB — URINALYSIS, COMPLETE (UACMP) WITH MICROSCOPIC
BACTERIA UA: NONE SEEN
Bilirubin Urine: NEGATIVE
GLUCOSE, UA: NEGATIVE mg/dL
Hgb urine dipstick: NEGATIVE
KETONES UR: NEGATIVE mg/dL
Leukocytes, UA: NEGATIVE
NITRITE: NEGATIVE
PROTEIN: NEGATIVE mg/dL
Specific Gravity, Urine: 1.011 (ref 1.005–1.030)
pH: 7 (ref 5.0–8.0)

## 2017-02-18 NOTE — ED Triage Notes (Signed)
[facility] to ed [facility]  [facility] and [facility] over.  Staff was able to catch pt before she fell to the floor.  Per staff pt was out for a brief few seconds and then responded.  Pt alert and at baseline for pt.  [facility] pt family who does not live in [place].  Upon arrival to ed pt placed on CM, EKG done, IV in place per ems in left hand 20G.

## 2017-02-18 NOTE — ED Notes (Signed)
Pt back from ct

## 2017-02-18 NOTE — ED Provider Notes (Signed)
[facility] Emergency Department Provider Note ____________________________________________   I have reviewed the triage vital signs and the triage nursing note.  HISTORY  Chief Complaint Altered Mental Status   Historian Level 5 Caveat History Limited by patient is a poor historian Granddaughter and healthcare power of attorney provides additional baseline history  HPI Gabrielle Coleman is a 77 y.o. female from nursing home, with a history of dementia, presents from nursing home at breakfast where she reportedly slumped over to the side and it sounds like was unresponsive for short period of time before she came back around.  Patient herself states that she remembers getting up and going to the breakfast on eating.  She does not remember the episode of passing out nor does she remember coming back around or the ride to the emergency department.  Reportedly there was some movements without clear seizure activity.  No body of the tongue or urinating on herself.  Patient did not report any headache before or after, neurologic complaints before after, chest pain or trouble breathing or palpitations before or after.  She is acting her normal baseline self with the granddaughter now.  Reportedly she had an episode like this in the past at least once or twice, no certain cause was found although it does not sound like that she had a neurology workup with respect to seizures or EEG.  She is never done a heart monitor.   Past Medical History:  Diagnosis Date  . Dementia   . History of pulmonary embolus (PE)   . HTN (hypertension)   . Hyperlipidemia   . 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 Osteoarthritis   . Osteopenia   . Vitamin D deficiency     Patient Active Problem List   Diagnosis Date Noted  . Urinary incontinence 11/19/2015  . Vitamin D deficiency   . Osteopenia   . Osteoarthritis   . Multiple old cerebral infarcts with  cognitive deficit   . Hyperlipidemia   . Essential hypertension   . History of pulmonary embolus (PE)   . History of DVT (deep vein thrombosis)   . Dementia     Past Surgical History:  Procedure Laterality Date  . APPENDECTOMY    . DILATION AND CURETTAGE OF UTERUS    . FEMORAL HERNIA REPAIR Right   . LUMBAR FUSION     L4-L5  . TOTAL HIP ARTHROPLASTY Right   . TUBAL LIGATION      Prior to Admission medications   Medication Sig Start Date End Date Taking? Authorizing Provider  amLODipine (NORVASC) 5 MG tablet Take 5 mg by mouth daily. 02/02/17  Yes [provider]  atorvastatin (LIPITOR) 40 MG tablet Take 1 tablet (40 mg total) by mouth daily. 11/17/15  Yes Cook, Jayce G, DO  donepezil (ARICEPT) 10 MG tablet Take 10 mg by mouth daily.   Yes [provider]  lisinopril (PRINIVIL,ZESTRIL) 20 MG tablet Take 20 mg by mouth daily. 02/02/17  Yes [provider]  memantine (NAMENDA) 10 MG tablet Take 10 mg by mouth 2 (two) times daily.   Yes [provider]  OVER THE COUNTER MEDICATION 500 mg. Cranberry and a probiotic capsule.   Yes [provider]  Zinc 50 MG CAPS Take 50 mg by mouth daily.    Yes [provider]    Allergies  Allergen Reactions  . Crestor [Rosuvastatin] Other (See Comments)    constipation  . Humibid La [Guaifenesin] Diarrhea and Nausea  And Vomiting  . Lipitor [Atorvastatin] Other (See Comments)    constipation  . Macrolides And Ketolides Nausea And Vomiting  . Septra [Sulfamethoxazole-Trimethoprim] Rash    Family History  Problem Relation Age of Onset  . Lung cancer Mother   . Hypertension Mother   . Heart disease Mother   . Prostate cancer Father   . Stroke Father   . Diabetes Mellitus II Sister   . Diabetes Mellitus II Brother   . Heart disease Brother   . Hypertension Son   . Liver disease Daughter     Social History Social History   Tobacco Use  . Smoking status: Never Smoker  . Smokeless  tobacco: Never Used  Substance Use Topics  . Alcohol use: No  . Drug use: No    Review of Systems  Constitutional: Negative for recent illness. Eyes: Negative for visual changes. ENT: Negative for sore throat. Cardiovascular: Negative for chest pain. Respiratory: Negative for shortness of breath. Gastrointestinal: Negative for vomiting and diarrhea. Genitourinary: Negative for dysuria.  History of prior UTIs. Musculoskeletal: Negative for back pain. Skin: Negative for rash. Neurological: Negative for headache.  ____________________________________________   PHYSICAL EXAM:  VITAL SIGNS: ED Triage Vitals  Enc Vitals Group     BP 02/18/17 0855 139/67     Pulse Rate 02/18/17 0855 66     Resp 02/18/17 0855 16     Temp 02/18/17 0855 97.6 F (36.4 C)     Temp Source 02/18/17 0855 Oral     SpO2 02/18/17 0855 98 %     Weight 02/18/17 0857 123 lb (55.8 kg)     Height --      Head Circumference --      Peak Flow --      Pain Score --      Pain Loc --      Pain Edu? --      Excl. in GC? --      Constitutional: Calm and cooperative, poor historian.  Well appearing and in no distress. HEENT   Head: Normocephalic and atraumatic.      Eyes: Conjunctivae are normal. Pupils equal and round.       Ears:         Nose: No congestion/rhinnorhea.   Mouth/Throat: Mucous membranes are moist.   Neck: No stridor. Cardiovascular/Chest: Normal rate, regular rhythm.  No murmurs, rubs, or gallops. Respiratory: Normal respiratory effort without tachypnea nor retractions. Breath sounds are clear and equal bilaterally. No wheezes/rales/rhonchi. Gastrointestinal: Soft. No distention, no guarding, no rebound. Nontender.    Genitourinary/rectal:Deferred Musculoskeletal: Nontender with normal range of motion in all extremities. No joint effusions.  No lower extremity tenderness.  No edema. Neurologic: No facial droop.  Normal speech and language. No gross or focal neurologic deficits are  appreciated. Skin:  Skin is warm, dry and intact. No rash noted. Psychiatric: Calm and cooperative, poor historian..   ____________________________________________  LABS (pertinent positives/negatives) I, Governor Rooksebecca Arelys Glassco, MD the attending physician have reviewed the labs noted below.  Labs Reviewed  COMPREHENSIVE METABOLIC PANEL - Abnormal; Notable for the following components:      Result Value   Glucose, Bld 135 (*)    Albumin 3.4 (*)    All other components within normal limits  CBC - Abnormal; Notable for the following components:   RDW 14.6 (*)    All other components within normal limits  URINALYSIS, COMPLETE (UACMP) WITH MICROSCOPIC - Abnormal; Notable for the following components:   Color, Urine YELLOW (*)  APPearance CLOUDY (*)    Squamous Epithelial / LPF 0-5 (*)    All other components within normal limits  TROPONIN I    ____________________________________________    EKG I, Governor Rooks, MD, the attending physician have personally viewed and interpreted all ECGs.  63 bpm P normal sinus rhythm.  Narrow QS P normal axis.  Normal ST and T wave. ____________________________________________  RADIOLOGY All Xrays were viewed by me.  Imaging interpreted by Radiologist, and I, Governor Rooks, MD the attending physician have reviewed the radiologist interpretation noted below.  CT head without contrast:  IMPRESSION: 1. No acute intracranial abnormality. 2. Old left frontal and right occipital infarcts. __________________________________________  PROCEDURES  Procedure(s) performed: None  Critical Care performed: None   ____________________________________________  ED COURSE / ASSESSMENT AND PLAN  Pertinent labs & imaging results that were available during my care of the patient were reviewed by me and considered in my medical decision making (see chart for details).   It sounds like the most likely thing that have been was syncope, although seizure is still  in the differential.  She is back to her normal self, but it sound like it took 15 minutes or so.  She does have a history of prior strokes and TIAs and is on baby aspirin, but there is no focal neurologic complaint before or after and she is back to normal mental status now.  And [facility]ron[facility] follow-up with an outpatient neurologist who may consider additional evaluation with respect to EEG.  Arrhythmia seems unlikely although I am recommending that she also follow-up with a cardiologist to consider some type of rhythm monitor.  Although uncertain etiology of the episode today, her exam and evaluation are overall reassuring.  It sounds like this has happened 2 or 3 times in the past, and I am not sure that hospital observation would reveal additional cause or be warranted for her.  I discussed this with the granddaughter who is very familiar with healthcare system, and she is agrees.  Patient will be discharged back to the nursing facility, recommendation for primary care follow-up, cardiology as well as neurology follow-up.    DIFFERENTIAL DIAGNOSIS: Including but not limited to syncope, seizure, arrhythmia, tachycardic episode, dysautonomia, head bleed, urinary tract infection, dehydration, electrolyte disturbance, etc.  CONSULTATIONS:   None  Patient / Family / Caregiver informed of clinical course, medical decision-making process, and agree with plan.   I discussed return precautions, follow-up instructions, and discharge instructions with patient and/or family.  Discharge Instructions : You are evaluated for episode of altered mental status, suspected passing out although seizure is also still a possibility.  Your exam and evaluation are overall reassuring in the emergency department today.  We discussed you should follow-up with your primary care doctor, as well as a cardiologist and a neurologist.  Return to the emergency room immediately for any worsening condition including  confusion altered mental status, additional episode of loss of consciousness, seizure activity, fever, weakness, numbness, confusion, other altered mental status, or any other symptoms concerning to you.    ___________________________________________   FINAL CLINICAL IMPRESSION(S) / ED DIAGNOSES   Final diagnoses:  Syncope, unspecified syncope type      ___________________________________________        Note: This dictation was prepared with Dragon dictation. Any transcriptional errors that result from this process are unintentional    Governor Rooks, MD 02/18/17 1311

## 2017-02-18 NOTE — ED Notes (Signed)
[facility] from [facility],  Pt alert on arrival, orientation per baseline, see documentation.  Pt placed on CM, VSS at this time. Awaiting md eval.

## 2017-02-18 NOTE — Discharge Instructions (Signed)
You are evaluated for episode of altered mental status, suspected passing out although seizure is also still a possibility.  Your exam and evaluation are overall reassuring in the emergency department today.  We discussed you should follow-up with your primary care doctor, as well as a cardiologist and a neurologist.  Return to the emergency room immediately for any worsening condition including confusion altered mental status, additional episode of loss of consciousness, seizure activity, fever, weakness, numbness, confusion, other altered mental status, or any other symptoms concerning to you.

## 2018-05-16 ENCOUNTER — Emergency Department: Payer: Medicare Other

## 2018-05-16 ENCOUNTER — Other Ambulatory Visit: Payer: Self-pay

## 2018-05-16 ENCOUNTER — Observation Stay: Payer: Medicare Other

## 2018-05-16 ENCOUNTER — Inpatient Hospital Stay
Admission: EM | Admit: 2018-05-16 | Discharge: 2018-05-18 | DRG: 101 | Disposition: A | Discharge status: Skilled Nursing Facility | Payer: Medicare Other | Source: Skilled Nursing Facility | Attending: Internal Medicine | Admitting: Internal Medicine

## 2018-05-16 DIAGNOSIS — G40409 Other generalized epilepsy and epileptic syndromes, not intractable, without status epilepticus: Secondary | ICD-10-CM | POA: Diagnosis not present

## 2018-05-16 DIAGNOSIS — N3000 Acute cystitis without hematuria: Secondary | ICD-10-CM | POA: Diagnosis present

## 2018-05-16 DIAGNOSIS — Z881 Allergy status to other antibiotic agents status: Secondary | ICD-10-CM

## 2018-05-16 DIAGNOSIS — F329 Major depressive disorder, single episode, unspecified: Secondary | ICD-10-CM | POA: Diagnosis present

## 2018-05-16 DIAGNOSIS — Z8249 Family history of ischemic heart disease and other diseases of the circulatory system: Secondary | ICD-10-CM

## 2018-05-16 DIAGNOSIS — F0280 Dementia in other diseases classified elsewhere without behavioral disturbance: Secondary | ICD-10-CM | POA: Diagnosis present

## 2018-05-16 DIAGNOSIS — Z801 Family history of malignant neoplasm of trachea, bronchus and lung: Secondary | ICD-10-CM

## 2018-05-16 DIAGNOSIS — Z86711 Personal history of pulmonary embolism: Secondary | ICD-10-CM

## 2018-05-16 DIAGNOSIS — E785 Hyperlipidemia, unspecified: Secondary | ICD-10-CM | POA: Diagnosis present

## 2018-05-16 DIAGNOSIS — M1990 Unspecified osteoarthritis, unspecified site: Secondary | ICD-10-CM | POA: Diagnosis present

## 2018-05-16 DIAGNOSIS — Z1159 Encounter for screening for other viral diseases: Secondary | ICD-10-CM

## 2018-05-16 DIAGNOSIS — Z833 Family history of diabetes mellitus: Secondary | ICD-10-CM

## 2018-05-16 DIAGNOSIS — Z96641 Presence of right artificial hip joint: Secondary | ICD-10-CM | POA: Diagnosis present

## 2018-05-16 DIAGNOSIS — M8580 Other specified disorders of bone density and structure, unspecified site: Secondary | ICD-10-CM | POA: Diagnosis present

## 2018-05-16 DIAGNOSIS — R569 Unspecified convulsions: Secondary | ICD-10-CM | POA: Diagnosis not present

## 2018-05-16 DIAGNOSIS — I10 Essential (primary) hypertension: Secondary | ICD-10-CM | POA: Diagnosis present

## 2018-05-16 DIAGNOSIS — E876 Hypokalemia: Secondary | ICD-10-CM | POA: Diagnosis present

## 2018-05-16 DIAGNOSIS — Z823 Family history of stroke: Secondary | ICD-10-CM

## 2018-05-16 DIAGNOSIS — Z888 Allergy status to other drugs, medicaments and biological substances status: Secondary | ICD-10-CM

## 2018-05-16 DIAGNOSIS — Z981 Arthrodesis status: Secondary | ICD-10-CM

## 2018-05-16 DIAGNOSIS — Z79899 Other long term (current) drug therapy: Secondary | ICD-10-CM

## 2018-05-16 DIAGNOSIS — Z8673 Personal history of transient ischemic attack (TIA), and cerebral infarction without residual deficits: Secondary | ICD-10-CM

## 2018-05-16 LAB — URINALYSIS, COMPLETE (UACMP) WITH MICROSCOPIC
Bilirubin Urine: NEGATIVE
Glucose, UA: NEGATIVE mg/dL
Hgb urine dipstick: NEGATIVE
Ketones, ur: 5 mg/dL — AB
Nitrite: NEGATIVE
Protein, ur: NEGATIVE mg/dL
Specific Gravity, Urine: 1.01 (ref 1.005–1.030)
pH: 8 (ref 5.0–8.0)

## 2018-05-16 LAB — TROPONIN I: Troponin I: <0.03 ng/mL (ref <0.03)

## 2018-05-16 LAB — BASIC METABOLIC PANEL
Anion gap: 9 (ref 5–15)
BUN: 14 mg/dL (ref 8–23)
CO2: 25 mmol/L (ref 22–32)
Calcium: 9.1 mg/dL (ref 8.9–10.3)
Chloride: 108 mmol/L (ref 98–111)
Creatinine, Ser: 0.87 mg/dL (ref 0.44–1.00)
GFR calc Af Amer: >60 mL/min (ref >60)
GFR calc non Af Amer: >60 mL/min (ref >60)
Glucose, Bld: 110 mg/dL — ABNORMAL HIGH (ref 70–99)
Potassium: 3.8 mmol/L (ref 3.5–5.1)
Sodium: 142 mmol/L (ref 135–145)

## 2018-05-16 LAB — CBC
HCT: 41 % (ref 36.0–46.0)
Hemoglobin: 13.7 g/dL (ref 12.0–15.0)
MCH: 31.8 pg (ref 26.0–34.0)
MCHC: 33.4 g/dL (ref 30.0–36.0)
MCV: 95.1 fL (ref 80.0–100.0)
Platelets: 228 10*3/uL (ref 150–400)
RBC: 4.31 MIL/uL (ref 3.87–5.11)
RDW: 13.8 % (ref 11.5–15.5)
WBC: 6.6 10*3/uL (ref 4.0–10.5)
nRBC: 0 % (ref 0.0–0.2)

## 2018-05-16 LAB — MRSA PCR SCREENING: MRSA by PCR: NEGATIVE

## 2018-05-16 LAB — SARS CORONAVIRUS 2 BY RT PCR (HOSPITAL ORDER, PERFORMED IN ~~LOC~~ HOSPITAL LAB): SARS Coronavirus 2: NEGATIVE

## 2018-05-16 MED ORDER — POLYETHYLENE GLYCOL 3350 17 G PO PACK: 17.0000 g | PACK | Freq: Every day | ORAL | Status: DC | PRN

## 2018-05-16 MED ORDER — ONDANSETRON HCL 4 MG PO TABS: 4.0000 mg | ORAL_TABLET | Freq: Four times a day (QID) | ORAL | Status: DC | PRN

## 2018-05-16 MED ORDER — ASPIRIN 81 MG PO CHEW
81.0000 mg | CHEWABLE_TABLET | Freq: Every day | ORAL | Status: DC
  Administered 2018-05-17 – 2018-05-18 (×2): 81 mg via ORAL
  Filled 2018-05-16 (×2): qty 1

## 2018-05-16 MED ORDER — ONDANSETRON HCL 4 MG/2ML IJ SOLN: 4.0000 mg | Freq: Four times a day (QID) | INTRAMUSCULAR | Status: DC | PRN

## 2018-05-16 MED ORDER — LORAZEPAM 2 MG/ML IJ SOLN
1.0000 mg | INTRAMUSCULAR | Status: DC | PRN
  Administered 2018-05-16: 2 mg via INTRAVENOUS
  Filled 2018-05-16: qty 1

## 2018-05-16 MED ORDER — ACETAMINOPHEN 650 MG RE SUPP: 650.0000 mg | RECTAL | Status: DC | PRN

## 2018-05-16 MED ORDER — VITAMIN D3 25 MCG (1000 UNIT) PO TABS
1000.0000 [IU] | ORAL_TABLET | Freq: Every day | ORAL | Status: DC
  Administered 2018-05-17 – 2018-05-18 (×2): 1000 [IU] via ORAL
  Filled 2018-05-16 (×3): qty 1

## 2018-05-16 MED ORDER — SERTRALINE HCL 25 MG PO TABS
25.0000 mg | ORAL_TABLET | Freq: Every day | ORAL | Status: DC
  Administered 2018-05-17 – 2018-05-18 (×2): 25 mg via ORAL
  Filled 2018-05-16 (×2): qty 1

## 2018-05-16 MED ORDER — HYDRALAZINE HCL 20 MG/ML IJ SOLN: 5.0000 mg | INTRAMUSCULAR | Status: DC | PRN

## 2018-05-16 MED ORDER — LEVETIRACETAM 500 MG PO TABS
500.0000 mg | ORAL_TABLET | Freq: Two times a day (BID) | ORAL | Status: DC
  Administered 2018-05-16 – 2018-05-18 (×4): 500 mg via ORAL
  Filled 2018-05-16 (×5): qty 1

## 2018-05-16 MED ORDER — ATORVASTATIN CALCIUM 20 MG PO TABS
40.0000 mg | ORAL_TABLET | Freq: Every day | ORAL | Status: DC
  Administered 2018-05-16 – 2018-05-17 (×2): 40 mg via ORAL
  Filled 2018-05-16 (×2): qty 2

## 2018-05-16 MED ORDER — GADOBUTROL 1 MMOL/ML IV SOLN
7.0000 mL | Freq: Once | INTRAVENOUS | Status: AC | PRN
  Administered 2018-05-16: 7 mL via INTRAVENOUS

## 2018-05-16 MED ORDER — ACETAMINOPHEN 325 MG PO TABS: 650.0000 mg | ORAL_TABLET | ORAL | Status: DC | PRN

## 2018-05-16 MED ORDER — AMLODIPINE BESYLATE 5 MG PO TABS
5.0000 mg | ORAL_TABLET | Freq: Every day | ORAL | Status: DC
  Administered 2018-05-17 – 2018-05-18 (×2): 5 mg via ORAL
  Filled 2018-05-16 (×2): qty 1

## 2018-05-16 MED ORDER — LORAZEPAM 2 MG/ML IJ SOLN: 1.0000 mg | INTRAMUSCULAR | Status: AC

## 2018-05-16 MED ORDER — MEMANTINE HCL 5 MG PO TABS
10.0000 mg | ORAL_TABLET | Freq: Two times a day (BID) | ORAL | Status: DC
  Administered 2018-05-16 – 2018-05-18 (×4): 10 mg via ORAL
  Filled 2018-05-16 (×4): qty 2

## 2018-05-16 MED ORDER — ENOXAPARIN SODIUM 40 MG/0.4ML ~~LOC~~ SOLN
40.0000 mg | SUBCUTANEOUS | Status: DC
  Administered 2018-05-16 – 2018-05-17 (×2): 40 mg via SUBCUTANEOUS
  Filled 2018-05-16 (×2): qty 0.4

## 2018-05-16 MED ORDER — LISINOPRIL 20 MG PO TABS
20.0000 mg | ORAL_TABLET | Freq: Every day | ORAL | Status: DC
  Administered 2018-05-17 – 2018-05-18 (×2): 20 mg via ORAL
  Filled 2018-05-16 (×2): qty 1

## 2018-05-16 MED ORDER — DONEPEZIL HCL 5 MG PO TABS
10.0000 mg | ORAL_TABLET | Freq: Every day | ORAL | Status: DC
  Administered 2018-05-16 – 2018-05-17 (×2): 10 mg via ORAL
  Filled 2018-05-16 (×2): qty 2

## 2018-05-16 MED ORDER — SODIUM CHLORIDE 0.9 % IV SOLN
75.0000 mL/h | INTRAVENOUS | Status: DC
  Administered 2018-05-16 – 2018-05-17 (×2): 75 mL/h via INTRAVENOUS

## 2018-05-16 NOTE — ED Notes (Addendum)
Seizure pads in place- fall bracelet on and yellow socks placed on pt- fall risk sign on door- blinds open to be able to visualize the pt- pt given a call bell and told to stay in the bed

## 2018-05-16 NOTE — ED Notes (Signed)
Report given to Danielle RN

## 2018-05-16 NOTE — Progress Notes (Signed)
MEDICATION RELATED CONSULT NOTE - INITIAL   Pharmacy Consult for Monitoring AED for DDI Indication: New Onset Seizures  Allergies  Allergen Reactions  . Crestor [Rosuvastatin] Other (See Comments)    constipation  . Humibid La [Guaifenesin] Diarrhea and Nausea And Vomiting  . Lipitor [Atorvastatin] Other (See Comments)    constipation  . Macrolides And Ketolides Nausea And Vomiting  . Septra [Sulfamethoxazole-Trimethoprim] Rash    Patient Measurements: Height: 5\' 7"  (170.2 cm) Weight: 160 lb (72.6 kg) IBW/kg (Calculated) : 61.6 Adjusted Body Weight:    Vital Signs: Temp: 98.3 F (36.8 C) (05/13 1716) Temp Source: Oral (05/13 1716) BP: 176/80 (05/13 1716) Pulse Rate: 77 (05/13 1716) Intake/Output from previous day: No intake/output data recorded. Intake/Output from this shift: No intake/output data recorded.  Labs: Recent Labs    05/16/18 1249  WBC 6.6  HGB 13.7  HCT 41.0  PLT 228  CREATININE 0.87   Estimated Creatinine Clearance: 51 mL/min (by C-G formula based on SCr of 0.87 mg/dL).   Assessment: Patient is a 78yo female admitted for new onset seizure. Pharmacy consulted to monitor AED for DDI. Patient was initiated on Levetiracetam 500mg  PO BID.  Plan:  Reviewed medications for interactions, none noted. Renal function acceptable to current dose. Will continue to follow.  Clovia Cuff, PharmD, BCPS 05/16/2018 5:40 PM

## 2018-05-16 NOTE — ED Notes (Signed)
ED TO INPATIENT HANDOFF REPORT  ED Nurse Name and Phone #: Terrian Ridlon [phone]  S Name/Age/Gender Gabrielle Coleman 78 y.o. female Room/Bed: ED24A/ED24A  Code Status   Code Status: Not on file  Home/SNF/Other Skilled nursing facility Patient oriented to: self Is this baseline? Yes   Triage Complete: Triage complete  Chief Complaint seizure  Triage Note Pt arrives via ems after having a full body [facility] at [facility] [place]- no history of seizures- per ems when the staff tried to pick her up after the first seizure she had a second seizure and vomited once- cbg 141- pt has a history of dementia and is A&O x1- cannot tell me her last name   Allergies Allergies  Allergen Reactions  . Crestor [Rosuvastatin] Other (See Comments)    constipation  . Humibid La [Guaifenesin] Diarrhea and Nausea And Vomiting  . Lipitor [Atorvastatin] Other (See Comments)    constipation  . Macrolides And Ketolides Nausea And Vomiting  . Septra [Sulfamethoxazole-Trimethoprim] Rash    Level of Care/Admitting Diagnosis ED Disposition    ED Disposition Condition Comment   Admit  Hospital Area: [facility] [[id number]]  Level of Care: Med-Surg [16]  Covid Evaluation: N/A  Diagnosis: Seizures (HCC) [161096]  Admitting Physician: Willadean Carol DODD [[id number]]  Attending Physician: Willadean Carol DODD [[id number]]  PT Class (Do Not Modify): Observation [104]  PT Acc Code (Do Not Modify): Observation [10022]       B Medical/Surgery History Past Medical History:  Diagnosis Date  . Dementia (HCC)   . History of pulmonary embolus (PE)   . HTN (hypertension)   . Hyperlipidemia   . 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 Osteoarthritis   . Osteopenia   . Vitamin D deficiency    Past Surgical History:  Procedure Laterality Date  . APPENDECTOMY    . DILATION AND CURETTAGE OF UTERUS    . FEMORAL HERNIA REPAIR Right   . LUMBAR FUSION     L4-L5  . TOTAL HIP  ARTHROPLASTY Right   . TUBAL LIGATION       A IV Location/Drains/Wounds Patient Lines/Drains/Airways Status   Active Line/Drains/Airways    Name:   Placement date:   Placement time:   Site:   Days:   Peripheral IV 05/16/18 Right Antecubital   05/16/18    1246    Antecubital   less than 1          Intake/Output Last 24 hours No intake or output data in the 24 hours ending 05/16/18 1548  Labs/Imaging Results for orders placed or performed during the hospital encounter of 05/16/18 (from the past 48 hour(s))  Basic metabolic panel - if new onset seizures     Status: Abnormal   Collection Time: 05/16/18 12:49 PM  Result Value Ref Range   Sodium 142 135 - 145 mmol/L   Potassium 3.8 3.5 - 5.1 mmol/L   Chloride 108 98 - 111 mmol/L   CO2 25 22 - 32 mmol/L   Glucose, Bld 110 (H) 70 - 99 mg/dL   BUN 14 8 - 23 mg/dL   Creatinine, Ser 2.95 0.44 - 1.00 mg/dL   Calcium 9.1 8.9 - 62.1 mg/dL   GFR calc non Af Amer >60 >60 mL/min   GFR calc Af Amer >60 >60 mL/min   Anion gap 9 5 - 15    Comment: Performed at [facility], [address], [place], [place] [address]  CBC - if new onset seizures  Status: None   Collection Time: 05/16/18 12:49 PM  Result Value Ref Range   WBC 6.6 4.0 - 10.5 K/uL   RBC 4.31 3.87 - 5.11 MIL/uL   Hemoglobin 13.7 12.0 - 15.0 g/dL   HCT 24.441.0 01.036.0 - 27.246.0 %   MCV 95.1 80.0 - 100.0 fL   MCH 31.8 26.0 - 34.0 pg   MCHC 33.4 30.0 - 36.0 g/dL   RDW 53.613.8 64.411.5 - 03.415.5 %   Platelets 228 150 - 400 K/uL   nRBC 0.0 0.0 - 0.2 %    Comment: Performed at [facility], [address] [place], [place] [address]  Troponin I - Add-On to previous collection     Status: None   Collection Time: 05/16/18 12:49 PM  Result Value Ref Range   Troponin I <0.03 <0.03 ng/mL    Comment: Performed at [facility], [address] [place], [place] [address]  SARS Coronavirus 2 (CEPHEID - Performed in [facility] hospital lab), Hosp Order     Status: None    Collection Time: 05/16/18  1:28 PM  Result Value Ref Range   SARS Coronavirus 2 NEGATIVE NEGATIVE    Comment: (NOTE) If result is NEGATIVE SARS-CoV-2 target nucleic acids are NOT DETECTED. The SARS-CoV-2 RNA is generally detectable in upper and lower  respiratory specimens during the acute phase of infection. The lowest  concentration of SARS-CoV-2 viral copies this assay can detect is 250  copies / mL. A negative result does not preclude SARS-CoV-2 infection  and should not be used as the sole basis for treatment or other  patient management decisions.  A negative result may occur with  improper specimen collection / handling, submission of specimen other  than nasopharyngeal swab, presence of viral mutation(s) within the  areas targeted by this assay, and inadequate number of viral copies  (<250 copies / mL). A negative result must be combined with clinical  observations, patient history, and epidemiological information. If result is POSITIVE SARS-CoV-2 target nucleic acids are DETECTED. The SARS-CoV-2 RNA is generally detectable in upper and lower  respiratory specimens dur ing the acute phase of infection.  Positive  results are indicative of active infection with SARS-CoV-2.  Clinical  correlation with patient history and other diagnostic information is  necessary to determine patient infection status.  Positive results do  not rule out bacterial infection or co-infection with other viruses. If result is PRESUMPTIVE POSTIVE SARS-CoV-2 nucleic acids MAY BE PRESENT.   A presumptive positive result was obtained on the submitted specimen  and confirmed on repeat testing.  While 2019 novel coronavirus  (SARS-CoV-2) nucleic acids may be present in the submitted sample  additional confirmatory testing may be necessary for epidemiological  and / or clinical management purposes  to differentiate between  SARS-CoV-2 and other Sarbecovirus currently known to infect humans.  If clinically  indicated additional testing with an alternate test  methodology [phone]([id number]) is advised. The SARS-CoV-2 RNA is generally  detectable in upper and lower respiratory sp ecimens during the acute  phase of infection. The expected result is Negative. Fact Sheet for Patients:  BoilerBrush.com.cyhttps://www.fda.gov/media/136312/download Fact Sheet for Healthcare Providers: https://pope.com/https://www.fda.gov/media/136313/download This test is not yet approved or cleared by the [place] FDA and has been authorized for detection and/or diagnosis of SARS-CoV-2 by FDA under an Emergency Use Authorization (EUA).  This EUA will remain in effect (meaning this test can be used) for the duration of the COVID-19 declaration under Section 564(b)(1) of the Act, 21  U.S.C. section 360bbb-3(b)(1), unless the authorization is terminated or revoked sooner. Performed at [facility], [address] [place], [place] [address]    Ct Head Wo Contrast  Result Date: 05/16/2018 CLINICAL DATA:  Seizure EXAM: CT HEAD WITHOUT CONTRAST TECHNIQUE: Contiguous axial images were obtained from the base of the skull through the vertex without intravenous contrast. COMPARISON:  02/18/2017 FINDINGS: Brain: There is atrophy and chronic small vessel disease changes. Old infarcts in the left frontal periventricular white matter and in the right occipital lobe. No acute intracranial abnormality. Specifically, no hemorrhage, hydrocephalus, mass lesion, acute infarction, or significant intracranial injury. Vascular: No hyperdense vessel or unexpected calcification. Skull: No acute calvarial abnormality. Sinuses/Orbits: Visualized paranasal sinuses and mastoids clear. Orbital soft tissues unremarkable. Other: None IMPRESSION: Old bilateral infarcts as above. Atrophy, chronic microvascular disease. No acute intracranial abnormality. Electronically Signed   By: Charlett Nose M.D.   On: 05/16/2018 13:15    Pending Labs [employer] (From admission, onward)     Start     Ordered   Signed and Held  Urinalysis, Complete w Microscopic  Once,   R     Signed and Held   Signed and Held  Basic metabolic panel  Tomorrow morning,   R     Signed and Held   Signed and Held  CBC  Tomorrow morning,   R     Signed and Held          Vitals/Pain Today's Vitals   05/16/18 1315 05/16/18 1330 05/16/18 1400 05/16/18 1430  BP: (!) 159/60 (!) 146/69 (!) 151/65 (!) 148/75  Pulse: 60 74    Resp: 14 15 11 12   Temp:      TempSrc:      SpO2: 98% 99%    Weight:      Height:      PainSc:        Isolation Precautions No active isolations  Medications Medications - No data to display  Mobility Not reported by facility- has not walked in ED High fall risk   Focused Assessments Cardiac Assessment Handoff:    Lab Results  Component Value Date   TROPONINI <0.03 05/16/2018   No results found for: DDIMER Does the Patient currently have chest pain? No     R Recommendations: See Admitting Provider Note  Report given to:   Additional Notes:  Pt only oriented to self

## 2018-05-16 NOTE — H&P (Signed)
[facility] [place] [facility]   PATIENT NAME: Gabrielle Coleman    MR#:  [id number]  DATE OF BIRTH:  22-Jan-1940  DATE OF ADMISSION:  05/16/2018  PRIMARY CARE PHYSICIAN: Patient, No Pcp Per   REQUESTING/REFERRING PHYSICIAN: Jene Every, MD  CHIEF COMPLAINT:   Chief Complaint  Patient presents with  . Seizures    HISTORY OF PRESENT ILLNESS:  Gabrielle Coleman  is a 78 y.o. female with a known history of hypertension, hyperlipidemia, history of stroke, dementia, history of PE who presented to the ED from [facility] [place] with new onset of seizure.  Patient had a witnessed generalized tonic-clonic seizure x 2 today. Patient has never had a seizure before in the past.  She does not know how long her seizures lasted today.  She denies any headache, vision changes, chest pain, palpitations, weakness, numbness, or tingling.  She denies any recent fevers or chills.  She denies any new medication changes recently.  In the ED, she was mildly hypertensive.  Labs were unremarkable.  CT head showed old strokes.  COVID testing was negative.  Due to new onset seizure, patient was admitted for further management.  PAST MEDICAL HISTORY:   Past Medical History:  Diagnosis Date  . Dementia (HCC)   . History of pulmonary embolus (PE)   . HTN (hypertension)   . Hyperlipidemia   . 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 Osteoarthritis   . Osteopenia   . Vitamin D deficiency     PAST SURGICAL HISTORY:   Past Surgical History:  Procedure Laterality Date  . APPENDECTOMY    . DILATION AND CURETTAGE OF UTERUS    . FEMORAL HERNIA REPAIR Right   . LUMBAR FUSION     L4-L5  . TOTAL HIP ARTHROPLASTY Right   . TUBAL LIGATION      SOCIAL HISTORY:   Social History   Tobacco Use  . Smoking status: Never Smoker  . Smokeless tobacco: Never Used  Substance Use Topics  . Alcohol use: No    FAMILY HISTORY:   Family History  Problem Relation  Age of Onset  . Lung cancer Mother   . Hypertension Mother   . Heart disease Mother   . Prostate cancer Father   . Stroke Father   . Diabetes Mellitus II Sister   . Diabetes Mellitus II Brother   . Heart disease Brother   . Hypertension Son   . Liver disease Daughter     DRUG ALLERGIES:   Allergies  Allergen Reactions  . Crestor [Rosuvastatin] Other (See Comments)    constipation  . Humibid La [Guaifenesin] Diarrhea and Nausea And Vomiting  . Lipitor [Atorvastatin] Other (See Comments)    constipation  . Macrolides And Ketolides Nausea And Vomiting  . Septra [Sulfamethoxazole-Trimethoprim] Rash    REVIEW OF SYSTEMS:   Review of Systems  Constitutional: Negative for chills and fever.  HENT: Negative for congestion and sore throat.   Eyes: Negative for blurred vision and double vision.  Respiratory: Negative for cough and shortness of breath.   Cardiovascular: Negative for chest pain and palpitations.  Gastrointestinal: Negative for nausea and vomiting.  Genitourinary: Negative for dysuria and urgency.  Musculoskeletal: Negative for back pain and neck pain.  Neurological: Positive for seizures. Negative for dizziness and headaches.  Psychiatric/Behavioral: Negative for depression. The patient is not nervous/anxious.     MEDICATIONS AT HOME:   Prior to Admission medications   Medication Sig Start  Date End Date Taking? Authorizing Provider  amLODipine (NORVASC) 5 MG tablet Take 5 mg by mouth daily. 02/02/17   [provider]  atorvastatin (LIPITOR) 40 MG tablet Take 1 tablet (40 mg total) by mouth daily. 11/17/15   Tommie Sams, DO  donepezil (ARICEPT) 10 MG tablet Take 10 mg by mouth daily.    [provider]  lisinopril (PRINIVIL,ZESTRIL) 20 MG tablet Take 20 mg by mouth daily. 02/02/17   [provider]  memantine (NAMENDA) 10 MG tablet Take 10 mg by mouth 2 (two) times daily.    [provider]  OVER THE COUNTER MEDICATION 500 mg.  Cranberry and a probiotic capsule.    [provider]  Zinc 50 MG CAPS Take 50 mg by mouth daily.     [provider]      VITAL SIGNS:  Blood pressure (!) 151/65, pulse 74, temperature 98.1 F (36.7 C), temperature source Oral, resp. rate 11, height  (1.702 m), weight 72.6 kg, SpO2 99 %.  PHYSICAL EXAMINATION:  Physical Exam  GENERAL:  78 y.o.-year-old patient lying in the bed with no acute distress.  EYES: Pupils equal, round, reactive to light and accommodation. No scleral icterus. Extraocular muscles intact.  HEENT: Head atraumatic, normocephalic. Oropharynx and nasopharynx clear.  NECK:  Supple, no jugular venous distention. No thyroid enlargement, no tenderness.  LUNGS: Normal breath sounds bilaterally, no wheezing, rales,rhonchi or crepitation. No use of accessory muscles of respiration.  CARDIOVASCULAR: RRR, S1, S2 normal. No murmurs, rubs, or gallops.  ABDOMEN: Soft, nontender, nondistended. Bowel sounds present. No organomegaly or mass.  EXTREMITIES: No pedal edema, cyanosis, or clubbing.  NEUROLOGIC: Cranial nerves II through XII are intact. + Global weakness.  No focal deficits. Sensation intact. Gait not checked.  PSYCHIATRIC: The patient is alert and oriented to person and place. SKIN: No obvious rash, lesion, or ulcer.   LABORATORY PANEL:   CBC Recent Labs  Lab 05/16/18 1249  WBC 6.6  HGB 13.7  HCT 41.0  PLT 228   ------------------------------------------------------------------------------------------------------------------  Chemistries  Recent Labs  Lab 05/16/18 1249  NA 142  K 3.8  CL 108  CO2 25  GLUCOSE 110*  BUN 14  CREATININE 0.87  CALCIUM 9.1   ------------------------------------------------------------------------------------------------------------------  Cardiac Enzymes Recent Labs  Lab 05/16/18 1249  TROPONINI <0.03    ------------------------------------------------------------------------------------------------------------------  RADIOLOGY:  Ct Head Wo Contrast  Result Date: 05/16/2018 CLINICAL DATA:  Seizure EXAM: CT HEAD WITHOUT CONTRAST TECHNIQUE: Contiguous axial images were obtained from the base of the skull through the vertex without intravenous contrast. COMPARISON:  02/18/2017 FINDINGS: Brain: There is atrophy and chronic small vessel disease changes. Old infarcts in the left frontal periventricular white matter and in the right occipital lobe. No acute intracranial abnormality. Specifically, no hemorrhage, hydrocephalus, mass lesion, acute infarction, or significant intracranial injury. Vascular: No hyperdense vessel or unexpected calcification. Skull: No acute calvarial abnormality. Sinuses/Orbits: Visualized paranasal sinuses and mastoids clear. Orbital soft tissues unremarkable. Other: None IMPRESSION: Old bilateral infarcts as above. Atrophy, chronic microvascular disease. No acute intracranial abnormality. Electronically Signed   By: Charlett Nose M.D.   On: 05/16/2018 13:15      IMPRESSION AND PLAN:   New onset seizure- may be related to old strokes -CT head showed old strokes -EEG and MRI brain with and without contast ordered -Check UA and chest x-ray to rule out infection as a cause of seizure -Neurology consult -Start Keppra  bid -Ativan IV PRN seizures -Seizure precautions -PT/OT consults  Hypertension- blood pressures mildly elevated in the ED -Continue home Norvasc and lisinopril -Hydralalzine IV as needed  History of multiple strokes -Continue aspirin and Lipitor  Dementia- oriented to person and place.  Unclear what patient's baseline is. -Continue home Aricept and Namenda  Depression-stable -Continue home Zoloft  DVT prophylaxis-Lovenox  All the records are reviewed and case discussed with ED provider. Management plans discussed with the patient, family and  they are in agreement.  CODE STATUS: Full  TOTAL TIME TAKING CARE OF THIS PATIENT: 45 minutes.    Jinny BlossomKaty D Alyssandra Hulsebus M.D on 05/16/2018 at 2:12 PM  Between 7am to 6pm - Pager [phone]- [phone]  After 6pm go to [email or web address] - Social research officer, governmentpassword [email or web address]  [facility] [place] [facility]  Office  [phone]  CC: Primary care physician; Patient, No Pcp Per   Note: This dictation was prepared with Dragon dictation along with smaller phrase technology. Any transcriptional errors that result from this process are unintentional.

## 2018-05-16 NOTE — ED Notes (Signed)
Patient transported to CT 

## 2018-05-16 NOTE — ED Triage Notes (Addendum)
Pt arrives via ems after having a full body seizure at [facility] [place]- no history of seizures- per ems when the staff tried to pick her up after the first seizure she had a second seizure and vomited once- cbg 141- pt has a history of dementia and is A&O x1- cannot tell me her last name

## 2018-05-16 NOTE — ED Provider Notes (Signed)
[facility] Emergency Department Provider Note   ____________________________________________    I have reviewed the triage vital signs and the nursing notes.   HISTORY  Chief Complaint Seizures   Patient with history of dementia, history is significant limited  HPI Gabrielle Coleman is a 78 y.o. female who presents after reported seizure.  Patient with a history of dementia from [facility] [place].  Reportedly the patient had 2 separate witnessed generalized tonic-clonic seizure, episode of vomiting after the second seizure.  No history of seizures noted.  No further history available at this time  Past Medical History:  Diagnosis Date  . Dementia (HCC)   . History of pulmonary embolus (PE)   . HTN (hypertension)   . Hyperlipidemia   . 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 Osteoarthritis   . Osteopenia   . Vitamin D deficiency     Patient Active Problem List   Diagnosis Date Noted  . Urinary incontinence 11/19/2015  . Vitamin D deficiency   . Osteopenia   . Osteoarthritis   . 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 Hyperlipidemia   . Essential hypertension   . History of pulmonary embolus (PE)   . History of DVT (deep vein thrombosis)   . Dementia [facility](HCC)     Past Surgical History:  Procedure Laterality Date  . APPENDECTOMY    . DILATION AND CURETTAGE OF UTERUS    . FEMORAL HERNIA REPAIR Right   . LUMBAR FUSION     L4-L5  . TOTAL HIP ARTHROPLASTY Right   . TUBAL LIGATION      Prior to Admission medications   Medication Sig Start Date End Date Taking? Authorizing Provider  amLODipine (NORVASC) 5 MG tablet Take 5 mg by mouth daily. 02/02/17   [provider]  atorvastatin (LIPITOR) 40 MG tablet Take 1 tablet (40 mg total) by mouth daily. 11/17/15   Tommie Samsook, Jayce G, DO  donepezil (ARICEPT) 10 MG tablet Take 10 mg by mouth daily.    [provider]  lisinopril  (PRINIVIL,ZESTRIL) 20 MG tablet Take 20 mg by mouth daily. 02/02/17   [provider]  memantine (NAMENDA) 10 MG tablet Take 10 mg by mouth 2 (two) times daily.    [provider]  OVER THE COUNTER MEDICATION 500 mg. Cranberry and a probiotic capsule.    [provider]  Zinc 50 MG CAPS Take 50 mg by mouth daily.     [provider]     Allergies Crestor [rosuvastatin]; Humibid la [guaifenesin]; Lipitor [atorvastatin]; Macrolides and ketolides; and Septra [sulfamethoxazole-trimethoprim]  Family History  Problem Relation Age of Onset  . Lung cancer Mother   . Hypertension Mother   . Heart disease Mother   . Prostate cancer Father   . Stroke Father   . Diabetes Mellitus II Sister   . Diabetes Mellitus II Brother   . Heart disease Brother   . Hypertension Son   . Liver disease Daughter     Social History Social History   Tobacco Use  . Smoking status: Never Smoker  . Smokeless tobacco: Never Used  Substance Use Topics  . Alcohol use: No  . Drug use: No    Review of Systems limited by dementia  Constitutional: No reports of fever Eyes: Patient denies visual changes ENT: She denies tongue injury Cardiovascular: Denies chest pain. Respiratory: Denies shortness of breath. Gastrointestinal: As above, no abdominal pain Genitourinary: Negative for dysuria.  Musculoskeletal: Negative for back pain. Skin: Negative for rash. Neurological: Negative for headaches    ____________________________________________   PHYSICAL EXAM:  VITAL SIGNS: ED Triage Vitals  Enc Vitals Group     BP 05/16/18 1248 (!) 132/92     Pulse Rate 05/16/18 1248 (!) 58     Resp --      Temp 05/16/18 1248 98.1 F (36.7 C)     Temp Source 05/16/18 1248 Oral     SpO2 05/16/18 1246 98 %     Weight 05/16/18 1248 72.6 kg (160 lb)     Height 05/16/18 1248 1.702 m (5\' 7" )     Head Circumference --      Peak Flow --      Pain Score 05/16/18 1248 0     Pain Loc --       Pain Edu? --      Excl. in GC? --     Constitutional: Alert Eyes: Conjunctivae are normal.  Head: Atraumatic. Nose: No congestion/rhinnorhea. Mouth/Throat: Mucous membranes are moist.  No tongue laceration Neck:  Painless ROM Cardiovascular: Normal rate, regular rhythm. Grossly normal heart sounds.  Good peripheral circulation. Respiratory: Normal respiratory effort.  No retractions. Lungs CTAB. Gastrointestinal: Soft and nontender. No distention.   Musculoskeletal:  Warm and well perfused Neurologic:  Normal speech and language. No gross focal neurologic deficits are appreciated.  Skin:  Skin is warm, dry and intact. No rash noted. Psychiatric: Mood and affect are normal. Speech and behavior are normal.  ____________________________________________   LABS (all labs ordered are listed, but only abnormal results are displayed)  Labs Reviewed  BASIC METABOLIC PANEL - Abnormal; Notable for the following components:      Result Value   Glucose, Bld 110 (*)    All other components within normal limits  SARS CORONAVIRUS 2 (HOSPITAL ORDER, PERFORMED IN [facility])  CBC  TROPONIN I   ____________________________________________  EKG  ED ECG REPORT I, Jene Every, the attending physician, personally viewed and interpreted this ECG.  Date: 05/16/2018  Rhythm: normal sinus rhythm QRS Axis: normal Intervals: normal ST/T Wave abnormalities: normal Narrative Interpretation: no evidence of acute ischemia  ____________________________________________  RADIOLOGY  CT head unremarkable ____________________________________________   PROCEDURES  Procedure(s) performed: No  Procedures   Critical Care performed: No ____________________________________________   INITIAL IMPRESSION / ASSESSMENT AND PLAN / ED COURSE  Pertinent labs & imaging results that were available during my care of the patient were reviewed by me and considered in my medical decision  making (see chart for details).  Patient presents after reported 2 generalized tonic-clonic seizures, these may have been part of the same episode.  No history of seizure.  Will check CT, labs, placed on a cardiac monitor, and closely observe her while we will await results  Lab work is overall unremarkable.  Given new onset seizure will discuss with hospitalist for admission    ____________________________________________   FINAL CLINICAL IMPRESSION(S) / ED DIAGNOSES  Final diagnoses:  Seizure (HCC)        Note:  This document was prepared using Dragon voice recognition software and may include unintentional dictation errors.   Jene Every, MD 05/16/18 1434

## 2018-05-16 NOTE — Progress Notes (Signed)
Family Meeting Note  Advance Directive:no  Today a meeting took place with the Patient.  Patient is able to participate.  The following clinical team members were present during this meeting:MD  The following were discussed:Patient's diagnosis: seizures, Patient's progosis: Unable to determine and Goals for treatment: Full Code  Additional follow-up to be provided: prn  Time spent during discussion:20 minutes  Gabrielle Sinclair, MD

## 2018-05-17 ENCOUNTER — Observation Stay: Payer: Medicare Other

## 2018-05-17 DIAGNOSIS — Z888 Allergy status to other drugs, medicaments and biological substances status: Secondary | ICD-10-CM | POA: Diagnosis not present

## 2018-05-17 DIAGNOSIS — M1990 Unspecified osteoarthritis, unspecified site: Secondary | ICD-10-CM | POA: Diagnosis present

## 2018-05-17 DIAGNOSIS — F0280 Dementia in other diseases classified elsewhere without behavioral disturbance: Secondary | ICD-10-CM | POA: Diagnosis present

## 2018-05-17 DIAGNOSIS — M8580 Other specified disorders of bone density and structure, unspecified site: Secondary | ICD-10-CM | POA: Diagnosis present

## 2018-05-17 DIAGNOSIS — Z881 Allergy status to other antibiotic agents status: Secondary | ICD-10-CM | POA: Diagnosis not present

## 2018-05-17 DIAGNOSIS — Z79899 Other long term (current) drug therapy: Secondary | ICD-10-CM | POA: Diagnosis not present

## 2018-05-17 DIAGNOSIS — E785 Hyperlipidemia, unspecified: Secondary | ICD-10-CM | POA: Diagnosis present

## 2018-05-17 DIAGNOSIS — Z96641 Presence of right artificial hip joint: Secondary | ICD-10-CM | POA: Diagnosis present

## 2018-05-17 DIAGNOSIS — I10 Essential (primary) hypertension: Secondary | ICD-10-CM | POA: Diagnosis present

## 2018-05-17 DIAGNOSIS — R569 Unspecified convulsions: Secondary | ICD-10-CM

## 2018-05-17 DIAGNOSIS — N3000 Acute cystitis without hematuria: Secondary | ICD-10-CM | POA: Diagnosis present

## 2018-05-17 DIAGNOSIS — F329 Major depressive disorder, single episode, unspecified: Secondary | ICD-10-CM | POA: Diagnosis present

## 2018-05-17 DIAGNOSIS — Z1159 Encounter for screening for other viral diseases: Secondary | ICD-10-CM | POA: Diagnosis not present

## 2018-05-17 DIAGNOSIS — Z8249 Family history of ischemic heart disease and other diseases of the circulatory system: Secondary | ICD-10-CM | POA: Diagnosis not present

## 2018-05-17 DIAGNOSIS — Z833 Family history of diabetes mellitus: Secondary | ICD-10-CM | POA: Diagnosis not present

## 2018-05-17 DIAGNOSIS — Z801 Family history of malignant neoplasm of trachea, bronchus and lung: Secondary | ICD-10-CM | POA: Diagnosis not present

## 2018-05-17 DIAGNOSIS — E876 Hypokalemia: Secondary | ICD-10-CM | POA: Diagnosis present

## 2018-05-17 DIAGNOSIS — Z823 Family history of stroke: Secondary | ICD-10-CM | POA: Diagnosis not present

## 2018-05-17 DIAGNOSIS — Z8673 Personal history of transient ischemic attack (TIA), and cerebral infarction without residual deficits: Secondary | ICD-10-CM | POA: Diagnosis not present

## 2018-05-17 DIAGNOSIS — Z981 Arthrodesis status: Secondary | ICD-10-CM | POA: Diagnosis not present

## 2018-05-17 DIAGNOSIS — Z86711 Personal history of pulmonary embolism: Secondary | ICD-10-CM | POA: Diagnosis not present

## 2018-05-17 DIAGNOSIS — G40409 Other generalized epilepsy and epileptic syndromes, not intractable, without status epilepticus: Secondary | ICD-10-CM | POA: Diagnosis present

## 2018-05-17 LAB — CBC
HCT: 37.5 % (ref 36.0–46.0)
Hemoglobin: 12.1 g/dL (ref 12.0–15.0)
MCH: 31.1 pg (ref 26.0–34.0)
MCHC: 32.3 g/dL (ref 30.0–36.0)
MCV: 96.4 fL (ref 80.0–100.0)
Platelets: 188 10*3/uL (ref 150–400)
RBC: 3.89 MIL/uL (ref 3.87–5.11)
RDW: 14.1 % (ref 11.5–15.5)
WBC: 4.2 10*3/uL (ref 4.0–10.5)
nRBC: 0 % (ref 0.0–0.2)

## 2018-05-17 LAB — BASIC METABOLIC PANEL
Anion gap: 8 (ref 5–15)
BUN: 13 mg/dL (ref 8–23)
CO2: 25 mmol/L (ref 22–32)
Calcium: 8.6 mg/dL — ABNORMAL LOW (ref 8.9–10.3)
Chloride: 110 mmol/L (ref 98–111)
Creatinine, Ser: 0.64 mg/dL (ref 0.44–1.00)
GFR calc Af Amer: >60 mL/min (ref >60)
GFR calc non Af Amer: >60 mL/min (ref >60)
Glucose, Bld: 81 mg/dL (ref 70–99)
Potassium: 3.3 mmol/L — ABNORMAL LOW (ref 3.5–5.1)
Sodium: 143 mmol/L (ref 135–145)

## 2018-05-17 MED ORDER — SODIUM CHLORIDE 0.9 % IV SOLN
1.0000 g | INTRAVENOUS | Status: DC
  Administered 2018-05-17 – 2018-05-18 (×2): 1 g via INTRAVENOUS
  Filled 2018-05-17 (×2): qty 1

## 2018-05-17 MED ORDER — POTASSIUM CHLORIDE 20 MEQ PO PACK
40.0000 meq | PACK | Freq: Once | ORAL | Status: AC
  Administered 2018-05-17: 40 meq via ORAL
  Filled 2018-05-17: qty 2

## 2018-05-17 NOTE — Care Management Obs Status (Signed)
MEDICARE OBSERVATION STATUS NOTIFICATION   Patient Details  Name: Gabrielle Coleman MRN: [id number] Date of Birth: 22-Mar-1940   Medicare Observation Status Notification Given:  Yes    Ruthe Mannan, Theresia Majors 05/17/2018, 2:31 PM

## 2018-05-17 NOTE — Progress Notes (Signed)
Tried to call grand daughter over the phone and left a voice mail.

## 2018-05-17 NOTE — TOC Initial Note (Signed)
Transition of Care [facility]) - Initial/Assessment Note    Patient Details  Name: Gabrielle Coleman MRN: [id number] Date of Birth: 13-Feb-1940  Transition of Care [facility]) CM/SW Contact:    Ruthe Mannan, LCSWA Phone Number: 05/17/2018, 4:17 PM  Clinical Narrative:  CSW found through chart review that patient is from assisted living. CSW spoke with patient's granddaughter Richardo Priest [phone]. Joni Reining states that patient lives at [facility] and has lived there for about 2 years. Per Joni Reining, patient used a small base quad cane and can walk up to 300 feet. Joni Reining would like patient to return to Home place when medically ready. CSW will continue to follow for discharge planning.                Expected Discharge Plan: Assisted Living Barriers to Discharge: Continued Medical Work up   Patient Goals and CMS Choice        Expected Discharge Plan and Services Expected Discharge Plan: Assisted Living       Living arrangements for the past 2 months: Assisted Living Facility                                      Prior Living Arrangements/Services Living arrangements for the past 2 months: Assisted Living Facility Lives with:: Facility Resident Patient language and need for interpreter reviewed:: Yes Do you feel safe going back to the place where you live?: Yes      Need for Family Participation in Patient Care: Yes (Comment) Care giver support system in place?: Yes (comment)   Criminal Activity/Legal Involvement Pertinent to Current Situation/Hospitalization: No - Comment as needed  Activities of Daily Living Home Assistive Devices/Equipment: Cane (specify quad or straight)(quad cane) ADL Screening (condition at time of admission) Patient's cognitive ability adequate to safely complete daily activities?: No Is the patient deaf or have difficulty hearing?: No Does the patient have difficulty seeing, even when wearing glasses/contacts?: No Does the patient have  difficulty concentrating, remembering, or making decisions?: Yes Patient able to express need for assistance with ADLs?: No Does the patient have difficulty dressing or bathing?: Yes Independently performs ADLs?: No Communication: Independent Dressing (OT): Needs assistance Is this a change from baseline?: Pre-admission baseline Grooming: Needs assistance Is this a change from baseline?: Pre-admission baseline Feeding: Needs assistance Is this a change from baseline?: Pre-admission baseline Bathing: Needs assistance Is this a change from baseline?: Pre-admission baseline Toileting: Needs assistance Is this a change from baseline?: Pre-admission baseline In/Out Bed: Needs assistance Is this a change from baseline?: Pre-admission baseline Walks in Home: Needs assistance Is this a change from baseline?: Pre-admission baseline Does the patient have difficulty walking or climbing stairs?: No Weakness of Legs: None Weakness of Arms/Hands: None  Permission Sought/Granted Permission sought to share information with : Case Manager, Magazine features editor, Family Supports Permission granted to share information with : Yes, Verbal Permission Granted              Emotional Assessment Appearance:: Appears stated age     Orientation: : Oriented to Self Alcohol / Substance Use: Not Applicable Psych Involvement: No (comment)  Admission diagnosis:  Seizure (HCC) [R56.9] Patient Active Problem List   Diagnosis Date Noted  . Seizures (HCC) 05/16/2018  . Urinary incontinence 11/19/2015  . Vitamin D deficiency   . Osteopenia   . Osteoarthritis   . 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 Hyperlipidemia   .  Essential hypertension   . History of pulmonary embolus (PE)   . History of DVT (deep vein thrombosis)   . Dementia [facility](HCC)    PCP:  Patient, No Pcp Per Pharmacy:   [facility] - [place], [place] - [address] [place]  [id number] Phone: [phone] Fax: [phone]  [facility] - [place], [place] - [place] [place] [id number] Phone: [phone] Fax: [phone]     Social Determinants of Health (SDOH) Interventions    Readmission Risk Interventions No flowsheet data found.

## 2018-05-17 NOTE — Progress Notes (Signed)
eeg completed ° °

## 2018-05-17 NOTE — Procedures (Signed)
History: 78 year old with new onset seizure  Sedation: None  Technique: This is a 21 channel routine scalp EEG performed at the bedside with bipolar and monopolar montages arranged in accordance to the international 10/20 system of electrode placement. One channel was dedicated to EKG recording.    Background: The background consists of intermixed alpha and beta activities. There is a well defined posterior dominant rhythm of 8-9 hz that attenuates with eye opening.  There is anterior shifting of the posterior dominant rhythm and increased delta activity with drowsiness, but sleep is not recorded.  Photic stimulation: Physiologic driving is not performed  EEG Abnormalities: None  Clinical Interpretation: This normal EEG is recorded in the waking and drowsy state. There was no seizure or seizure predisposition recorded on this study. Please note that lack of epileptiform activity on EEG does not preclude the possibility of epilepsy.   [name], MD [facility] [phone]  If 7pm- 7am, please page neurology on call as listed in AMION.

## 2018-05-17 NOTE — Evaluation (Signed)
Physical Therapy Evaluation Patient Details Name: Gabrielle Coleman MRN: [id number] DOB: 1940/01/05 Today's Date: 05/17/2018   History of Present Illness  Patient is a pleasant 78 year old female whose facility called EMS for two witnessed tonic clonic seizures. Per previous documentation patient has no history of seizures. Patient is a resident of [address] of [place].  per documentation patient PMH includes: HTN, HLD, hx of stroke, dementia (A and Ox1), hx of PE, and osteopenia.   Clinical Impression  Patient is a pleasantly confused 78 year old female who presents with generalized weakness and instability. Noted right ankle inversion and plantarflexion at resting position with resultant vaulting pattern of ambulation. Patient unable to tell PT if she has a brace/AFO at home for her ankle or other relevant PMH. Patient was mod I with bed mobility and requires CGA for transfers and ambulation with use of RW. Extra time for cueing and sequencing of tasks required. Patient requires BUE support for dynamic stability with one episode of near LOB when attempted to move robe with one hand requiring PT to assist with stabilizing for COM. Patient will benefit from skilled physical therapy while in hospital to increase strength, stability, and improve capacity of mobility. Upon discharge back to [facility] (ALF) patient would benefit from HHPT and OOB supervision to decrease falls risk and improve quality of life.     Follow Up Recommendations Home health PT;Supervision for mobility/OOB(lives at assisted living memory care)    Equipment Recommendations  None recommended by PT    Recommendations for Other Services       Precautions / Restrictions Precautions Precautions: Fall;Other (comment) Precaution Comments: seizure Restrictions Weight Bearing Restrictions: No      Mobility  Bed Mobility Overal bed mobility: Modified Independent             General bed mobility  comments: able to roll L and R with UE support, able to supine<>sit with extra time and UE support.   Transfers Overall transfer level: Needs assistance Equipment used: Rolling walker (2 wheeled) Transfers: Sit to/from Stand Sit to Stand: Supervision;Min guard         General transfer comment: STS with cueing for hand placement and CGA due to hx of seizure and instability.   Ambulation/Gait Ambulation/Gait assistance: Min guard Gait Distance (Feet): 30 Feet Assistive device: Rolling walker (2 wheeled)   Gait velocity: decreased velocity    General Gait Details: right foot inverted and planterflexed with a vaulating pattern and use of RW, narrow BOS/shuffle step with decrased velocity,   Stairs            Wheelchair Mobility    Modified Rankin (Stroke Patients Only)       Balance Overall balance assessment: History of Falls;Needs assistance Sitting-balance support: Bilateral upper extremity supported;Feet supported Sitting balance-Leahy Scale: Poor Sitting balance - Comments: sits EOB with UE support without LOB but does have posterior tilt when attempting to lift hand or legs Postural control: Posterior lean Standing balance support: Bilateral upper extremity supported Standing balance-Leahy Scale: Poor Standing balance comment: Patient requires BUE support for ambulation                             Pertinent Vitals/Pain Pain Assessment: No/denies pain    Home Living Family/patient expects to be discharged to:: Assisted living               Home Equipment: Dan Humphreys - 2 wheels Additional Comments: Patient  resident of [facility] [place] [facility]. History unable to be fully obtained from patient.     Prior Function Level of Independence: Needs assistance   Gait / Transfers Assistance Needed: walks with RW   ADL's / Homemaking Assistance Needed: recieves assistance at facility   Comments: unable to obtain full information from  patient. Per patient she "walks a lot".      Hand Dominance        Extremity/Trunk Assessment   Upper Extremity Assessment Upper Extremity Assessment: Generalized weakness;Overall WFL for tasks assessed    Lower Extremity Assessment Lower Extremity Assessment: LLE deficits/detail;RLE deficits/detail RLE Deficits / Details: r foot inverted and pf however able to obtain nuetral position with overpressure, grossly 3/5  RLE Sensation: (difficult to assess due to impaired cognition ) RLE Coordination: decreased gross motor LLE Deficits / Details: grossly 4-/5 LLE Sensation: (unable to assess due to cognition ) LLE Coordination: decreased gross motor       Communication   Communication: No difficulties  Cognition Arousal/Alertness: Awake/alert Behavior During Therapy: WFL for tasks assessed/performed Overall Cognitive Status: Within Functional Limits for tasks assessed                                 General Comments: Patient oriented to self, able to converse however unable to relay history. Follows simple/complex commands      General Comments General comments (skin integrity, edema, etc.): noted bruising/tenderness to gluteal region when cleaning/changing patient    Exercises Other Exercises Other Exercises: patient performed bed mobility for cleanup s/p bowel movement with good command follow and ability to roll L and R. tenderness to bilateral buttocks noted with discoloration of skin  Other Exercises: sit to stand transfer education with hand placement and use of walker to maintain COM.    Assessment/Plan    PT Assessment Patient needs continued PT services  PT Problem List Decreased strength;Decreased activity tolerance;Decreased coordination;Decreased mobility;Decreased balance;Decreased cognition;Decreased safety awareness;Decreased knowledge of precautions       PT Treatment Interventions DME instruction;Gait training;Functional mobility  training;Therapeutic activities;Therapeutic exercise;Neuromuscular re-education;Balance training;Patient/family education;Manual techniques    PT Goals (Current goals can be found in the Care Plan section)  Acute Rehab PT Goals PT Goal Formulation: Patient unable to participate in goal setting    Frequency Min 2X/week   Barriers to discharge   would benefit from HHPT and supervision OOB    Co-evaluation               AM-PAC PT "6 Clicks" Mobility  Outcome Measure Help needed turning from your back to your side while in a flat bed without using bedrails?: A Little Help needed moving from lying on your back to sitting on the side of a flat bed without using bedrails?: A Little Help needed moving to and from a bed to a chair (including a wheelchair)?: A Little Help needed standing up from a chair using your arms (e.g., wheelchair or bedside chair)?: A Little Help needed to walk in hospital room?: A Little Help needed climbing 3-5 steps with a railing? : A Lot 6 Click Score: 17    End of Session Equipment Utilized During Treatment: Gait belt Activity Tolerance: Patient tolerated treatment well;Patient limited by fatigue Patient left: in bed;with call bell/phone within reach;with bed alarm set Nurse Communication: Mobility status PT Visit Diagnosis: Unsteadiness on feet (R26.81);Other abnormalities of gait and mobility (R26.89);History of falling (Z91.81);Muscle weakness (generalized) (M62.81);Difficulty  in walking, not elsewhere classified (R26.2)    Time: 7829-5621 PT Time Calculation (min) (ACUTE ONLY): 33 min   Charges:   PT Evaluation $PT Eval Moderate Complexity: 1 Mod PT Treatments $Therapeutic Activity: 8-22 mins        Precious Bard, PT, DPT    Precious Bard 05/17/2018, 11:03 AM

## 2018-05-17 NOTE — Progress Notes (Signed)
[facility] - [place] at [facility]   PATIENT NAME: Gabrielle Coleman    MR#:  [id number]  DATE OF BIRTH:  1940/05/12  SUBJECTIVE:  CHIEF COMPLAINT:   Chief Complaint  Patient presents with   Seizures   -Patient alert, pleasant and answering questions.  Has underlying dementia. -No further seizures since admission  REVIEW OF SYSTEMS:  Review of Systems  Unable to perform ROS: Dementia    DRUG ALLERGIES:   Allergies  Allergen Reactions   Crestor [Rosuvastatin] Other (See Comments)    constipation   Humibid La [Guaifenesin] Diarrhea and Nausea And Vomiting   Lipitor [Atorvastatin] Other (See Comments)    constipation   Macrolides And Ketolides Nausea And Vomiting   Septra [Sulfamethoxazole-Trimethoprim] Rash    VITALS:  Blood pressure (!) 136/57, pulse 73, temperature 97.9 F (36.6 C), temperature source Oral, resp. rate 20, height 5\' 7"  (1.702 m), weight 72.6 kg, SpO2 97 %.  PHYSICAL EXAMINATION:  Physical Exam   GENERAL:  78 y.o.-year-old thin built, ill nourished, patient lying in the bed with no acute distress.  EYES: Pupils equal, round, reactive to light and accommodation. No scleral icterus. Extraocular muscles intact.  HEENT: Head atraumatic, normocephalic. Oropharynx and nasopharynx clear.  NECK:  Supple, no jugular venous distention. No thyroid enlargement, no tenderness.  LUNGS: Normal breath sounds bilaterally, no wheezing, rales,rhonchi or crepitation. No use of accessory muscles of respiration.  Decreased bibasilar breath sounds CARDIOVASCULAR: S1, S2 normal. No murmurs, rubs, or gallops.  ABDOMEN: Soft, nontender, nondistended. Bowel sounds present. No organomegaly or mass.  EXTREMITIES: No pedal edema, cyanosis, or clubbing.  NEUROLOGIC: Cranial nerves II through XII are intact. Muscle strength 5/5 in all extremities. Sensation intact. Gait not checked.  PSYCHIATRIC: The patient is alert and oriented to self SKIN: No obvious  rash, lesion, or ulcer.    LABORATORY PANEL:   CBC Recent Labs  Lab 05/17/18 0444  WBC 4.2  HGB 12.1  HCT 37.5  PLT 188   ------------------------------------------------------------------------------------------------------------------  Chemistries  Recent Labs  Lab 05/17/18 0444  NA 143  K 3.3*  CL 110  CO2 25  GLUCOSE 81  BUN 13  CREATININE 0.64  CALCIUM 8.6*   ------------------------------------------------------------------------------------------------------------------  Cardiac Enzymes Recent Labs  Lab 05/16/18 1249  TROPONINI <0.03   ------------------------------------------------------------------------------------------------------------------  RADIOLOGY:  Dg Chest 1 View  Result Date: 05/16/2018 CLINICAL DATA:  Seizure EXAM: CHEST  1 VIEW COMPARISON:  None. FINDINGS: The heart size and mediastinal contours are within normal limits. Both lungs are clear. The visualized skeletal structures are unremarkable. IMPRESSION: No active disease. Electronically Signed   By: Marlan Palau M.D.   On: 05/16/2018 18:28   Ct Head Wo Contrast  Result Date: 05/16/2018 CLINICAL DATA:  Seizure EXAM: CT HEAD WITHOUT CONTRAST TECHNIQUE: Contiguous axial images were obtained from the base of the skull through the vertex without intravenous contrast. COMPARISON:  02/18/2017 FINDINGS: Brain: There is atrophy and chronic small vessel disease changes. Old infarcts in the left frontal periventricular white matter and in the right occipital lobe. No acute intracranial abnormality. Specifically, no hemorrhage, hydrocephalus, mass lesion, acute infarction, or significant intracranial injury. Vascular: No hyperdense vessel or unexpected calcification. Skull: No acute calvarial abnormality. Sinuses/Orbits: Visualized paranasal sinuses and mastoids clear. Orbital soft tissues unremarkable. Other: None IMPRESSION: Old bilateral infarcts as above. Atrophy, chronic microvascular disease. No  acute intracranial abnormality. Electronically Signed   By: Charlett Nose M.D.   On: 05/16/2018 13:15   Mr Laqueta Jean YQ  Contrast  Result Date: 05/16/2018 CLINICAL DATA:  New onset seizure. History of hypertension hyperlipidemia stroke and dementia EXAM: MRI HEAD WITHOUT AND WITH CONTRAST TECHNIQUE: Multiplanar, multiecho pulse sequences of the brain and surrounding structures were obtained without and with intravenous contrast. CONTRAST:  7 mL Gadovist IV COMPARISON:  CT head 05/16/2018 FINDINGS: Brain: Negative for acute infarct. Generalized atrophy with chronic ischemic change in the white matter. Chronic right posterior cerebral artery infarct involving the inferior right occipital lobe. Cystic encephalomalacia left frontal white matter compatible with chronic ischemia. Negative for hemorrhage or mass. Normal enhancement following contrast infusion. Vascular: Normal arterial flow voids Skull and upper cervical spine: Negative Sinuses/Orbits: Negative Other: None IMPRESSION: No acute abnormality. Atrophy and chronic ischemic changes as above. Electronically Signed   By: Marlan Palauharles  Clark M.D.   On: 05/16/2018 20:00    EKG:   Orders placed or performed during the hospital encounter of 05/16/18   ED EKG   ED EKG   EKG 12-Lead   EKG 12-Lead    ASSESSMENT AND PLAN:   78 year old female with past medical history significant for dementia, prior history of stroke, history of PE presents from home place of [place] with new onset grand mal seizures.  1.  Generalized tonic-clonic seizure-patient has underlying dementia and unable to provide more history.  According to granddaughter patient does have occasional jerking movements in the past. -Appreciate neurology consult.  MRI of the brain with and without contrast has remained negative -EEG is pending. -Patient started on Keppra.  Might need long-term antiepileptic therapy especially if has known history of seizures.  2.  Hypokalemia-being  corrected  3.  Acute cystitis-started on Rocephin.  Urine cultures have been sent  4.  Hypertension-on lisinopril  5.  Dementia-continue Namenda and Aricept.  Also on Zoloft.  6.  DVT prophylaxis-Lovenox  Physical therapy consulted   All the records are reviewed and case discussed with Care Management/Social Workerr. Management plans discussed with the patient, family and they are in agreement.  CODE STATUS: Full code  TOTAL TIME TAKING CARE OF THIS PATIENT: 37 minutes.   POSSIBLE D/C IN 1-2 DAYS, DEPENDING ON CLINICAL CONDITION.   Enid Baasadhika Zainab Crumrine M.D on 05/17/2018 at 2:31 PM  Between 7am to 6pm - Pager - [phone]  After 6pm go to [email or web address] - password [email or web address]  [facility]  Office  [phone]  CC: Primary care physician; Patient, No Pcp Per

## 2018-05-17 NOTE — Consult Note (Signed)
Reason for Consult: seizure  Referring Physician: Dr. Nancy Marus  CC: seizure activity   HPI: Gabrielle Coleman is an 78 y.o. female with a known history of hypertension, hyperlipidemia, history of stroke, dementia, history of PE who presented to the ED from [facility] with new onset of seizure.  Patient had a witnessed generalized tonic-clonic seizure x 2 yesterday and was post ictal.  . Patient has never had a seizure before in the past.  She does not know how long her seizures lasted today.  She denies any headache, vision changes, chest pain, palpitations, weakness, numbness, or tingling.  She denies any recent fevers or chills.  She denies any new medication changes recently. Now mentation is improved but is still confused.    Past Medical History:  Diagnosis Date  . Dementia (HCC)   . History of pulmonary embolus (PE)   . HTN (hypertension)   . Hyperlipidemia   . 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 Osteoarthritis   . Osteopenia   . Vitamin D deficiency     Past Surgical History:  Procedure Laterality Date  . APPENDECTOMY    . DILATION AND CURETTAGE OF UTERUS    . FEMORAL HERNIA REPAIR Right   . LUMBAR FUSION     L4-L5  . TOTAL HIP ARTHROPLASTY Right   . TUBAL LIGATION      Family History  Problem Relation Age of Onset  . Lung cancer Mother   . Hypertension Mother   . Heart disease Mother   . Prostate cancer Father   . Stroke Father   . Diabetes Mellitus II Sister   . Diabetes Mellitus II Brother   . Heart disease Brother   . Hypertension Son   . Liver disease Daughter     Social History:  reports that she has never smoked. She has never used smokeless tobacco. She reports that she does not drink alcohol or use drugs.  Allergies  Allergen Reactions  . Crestor [Rosuvastatin] Other (See Comments)    constipation  . Humibid La [Guaifenesin] Diarrhea and Nausea And Vomiting  . Lipitor [Atorvastatin] Other (See Comments)     constipation  . Macrolides And Ketolides Nausea And Vomiting  . Septra [Sulfamethoxazole-Trimethoprim] Rash    Medications: I have reviewed the patient's current medications.  ROS: Confusion unable to access   Physical Examination: Blood pressure (!) 133/58, pulse 68, temperature 98.1 F (36.7 C), resp. rate 17, height  (1.702 m), weight 72.6 kg, SpO2 100 %.   Neurological Examination   Mental Status: Alert to name Cranial Nerves: II: Discs flat bilaterally; Visual fields grossly normal, pupils equal, round, reactive to light and accommodation III,IV, VI: ptosis not present, extra-ocular motions intact bilaterally V,VII: smile symmetric, facial light touch sensation normal bilaterally VIII: hearing normal bilaterally IX,X: gag reflex present XI: bilateral shoulder shrug XII: midline tongue extension Motor: Right : Upper extremity   5/5    Left:     Upper extremity   5/5  Lower extremity   5/5     Lower extremity   5/5 Tone and bulk:normal tone throughout; no atrophy noted Sensory: Pinprick and light touch intact throughout, bilaterally Deep Tendon Reflexes:1+ and symmetric throughout Plantars: Right: downgoing   Left: downgoing Cerebellar: normal finger-to-nose, normal rapid alternating movements and normal heel-to-shin test Gait: not tested      Laboratory Studies:   Basic Metabolic Panel: Recent Labs  Lab 05/16/18 1249 05/17/18 0444  NA 142 143  K  3.8 3.3*  CL 108 110  CO2 25 25  GLUCOSE 110* 81  BUN 14 13  CREATININE 0.87 0.64  CALCIUM 9.1 8.6*    Liver Function Tests: No results for input(s): AST, ALT, ALKPHOS, BILITOT, PROT, ALBUMIN in the last 168 hours. No results for input(s): LIPASE, AMYLASE in the last 168 hours. No results for input(s): AMMONIA in the last 168 hours.  CBC: Recent Labs  Lab 05/16/18 1249 05/17/18 0444  WBC 6.6 4.2  HGB 13.7 12.1  HCT 41.0 37.5  MCV 95.1 96.4  PLT 228 188    Cardiac Enzymes: Recent Labs  Lab  05/16/18 1249  TROPONINI <0.03    BNP: Invalid input(s): POCBNP  CBG: No results for input(s): GLUCAP in the last 168 hours.  Microbiology: Results for orders placed or performed during the hospital encounter of 05/16/18  SARS Coronavirus 2 (CEPHEID - Performed in [facility] hospital lab), Hosp Order     Status: None   Collection Time: 05/16/18  1:28 PM  Result Value Ref Range Status   SARS Coronavirus 2 NEGATIVE NEGATIVE Final    Comment: (NOTE) If result is NEGATIVE SARS-CoV-2 target nucleic acids are NOT DETECTED. The SARS-CoV-2 RNA is generally detectable in upper and lower  respiratory specimens during the acute phase of infection. The lowest  concentration of SARS-CoV-2 viral copies this assay can detect is 250  copies / mL. A negative result does not preclude SARS-CoV-2 infection  and should not be used as the sole basis for treatment or other  patient management decisions.  A negative result may occur with  improper specimen collection / handling, submission of specimen other  than nasopharyngeal swab, presence of viral mutation(s) within the  areas targeted by this assay, and inadequate number of viral copies  (<250 copies / mL). A negative result must be combined with clinical  observations, patient history, and epidemiological information. If result is POSITIVE SARS-CoV-2 target nucleic acids are DETECTED. The SARS-CoV-2 RNA is generally detectable in upper and lower  respiratory specimens dur ing the acute phase of infection.  Positive  results are indicative of active infection with SARS-CoV-2.  Clinical  correlation with patient history and other diagnostic information is  necessary to determine patient infection status.  Positive results do  not rule out bacterial infection or co-infection with other viruses. If result is PRESUMPTIVE POSTIVE SARS-CoV-2 nucleic acids MAY BE PRESENT.   A presumptive positive result was obtained on the submitted specimen  and  confirmed on repeat testing.  While 2019 novel coronavirus  (SARS-CoV-2) nucleic acids may be present in the submitted sample  additional confirmatory testing may be necessary for epidemiological  and / or clinical management purposes  to differentiate between  SARS-CoV-2 and other Sarbecovirus currently known to infect humans.  If clinically indicated additional testing with an alternate test  methodology [id number]) is advised. The SARS-CoV-2 RNA is generally  detectable in upper and lower respiratory sp ecimens during the acute  phase of infection. The expected result is Negative. Fact Sheet for Patients:  BoilerBrush.com.cy Fact Sheet for Healthcare Providers: https://pope.com/ This test is not yet approved or cleared by the [place] FDA and has been authorized for detection and/or diagnosis of SARS-CoV-2 by FDA under an Emergency Use Authorization (EUA).  This EUA will remain in effect (meaning this test can be used) for the duration of the COVID-19 declaration under Section 564(b)(1) of the Act, 21 U.S.C. section 360bbb-3(b)(1), unless the authorization is terminated or revoked sooner. Performed  at [facility], [address], [place], [place] [address]   MRSA PCR Screening     Status: None   Collection Time: 05/16/18  6:02 PM  Result Value Ref Range Status   MRSA by PCR NEGATIVE NEGATIVE Final    Comment:        The GeneXpert MRSA Assay (FDA approved for NASAL specimens only), is one component of a comprehensive MRSA colonization surveillance program. It is not intended to diagnose MRSA infection nor to guide or monitor treatment for MRSA infections. Performed at [facility], [address] [place], [place] [address]     Coagulation Studies: No results for input(s): LABPROT, INR in the last 72 hours.  Urinalysis:  Recent Labs  Lab 05/16/18 1718  COLORURINE YELLOW*  LABSPEC 1.010  PHURINE  8.0  GLUCOSEU NEGATIVE  HGBUR NEGATIVE  BILIRUBINUR NEGATIVE  KETONESUR 5*  PROTEINUR NEGATIVE  NITRITE NEGATIVE  LEUKOCYTESUR SMALL*    Lipid Panel:     Component Value Date/Time   CHOL 148 03/18/2016 1018   TRIG 81.0 03/18/2016 1018   HDL 55.70 03/18/2016 1018   CHOLHDL 3 03/18/2016 1018   VLDL 16.2 03/18/2016 1018   LDLCALC 76 03/18/2016 1018    HgbA1C:  Lab Results  Component Value Date   HGBA1C 4.9 10/29/2015    Urine Drug Screen:  No results found for: LABOPIA, COCAINSCRNUR, LABBENZ, AMPHETMU, THCU, LABBARB  Alcohol Level: No results for input(s): ETH in the last 168 hours.  Other results: EKG: normal EKG, normal sinus rhythm, unchanged from previous tracings.  Imaging: Dg Chest 1 View  Result Date: 05/16/2018 CLINICAL DATA:  Seizure EXAM: CHEST  1 VIEW COMPARISON:  None. FINDINGS: The heart size and mediastinal contours are within normal limits. Both lungs are clear. The visualized skeletal structures are unremarkable. IMPRESSION: No active disease. Electronically Signed   By: Marlan Palau M.D.   On: 05/16/2018 18:28   Ct Head Wo Contrast  Result Date: 05/16/2018 CLINICAL DATA:  Seizure EXAM: CT HEAD WITHOUT CONTRAST TECHNIQUE: Contiguous axial images were obtained from the base of the skull through the vertex without intravenous contrast. COMPARISON:  02/18/2017 FINDINGS: Brain: There is atrophy and chronic small vessel disease changes. Old infarcts in the left frontal periventricular white matter and in the right occipital lobe. No acute intracranial abnormality. Specifically, no hemorrhage, hydrocephalus, mass lesion, acute infarction, or significant intracranial injury. Vascular: No hyperdense vessel or unexpected calcification. Skull: No acute calvarial abnormality. Sinuses/Orbits: Visualized paranasal sinuses and mastoids clear. Orbital soft tissues unremarkable. Other: None IMPRESSION: Old bilateral infarcts as above. Atrophy, chronic microvascular disease. No  acute intracranial abnormality. Electronically Signed   By: Charlett Nose M.D.   On: 05/16/2018 13:15   Mr Laqueta Jean GN Contrast  Result Date: 05/16/2018 CLINICAL DATA:  New onset seizure. History of hypertension hyperlipidemia stroke and dementia EXAM: MRI HEAD WITHOUT AND WITH CONTRAST TECHNIQUE: Multiplanar, multiecho pulse sequences of the brain and surrounding structures were obtained without and with intravenous contrast. CONTRAST:  7 mL Gadovist IV COMPARISON:  CT head 05/16/2018 FINDINGS: Brain: Negative for acute infarct. Generalized atrophy with chronic ischemic change in the white matter. Chronic right posterior cerebral artery infarct involving the inferior right occipital lobe. Cystic encephalomalacia left frontal white matter compatible with chronic ischemia. Negative for hemorrhage or mass. Normal enhancement following contrast infusion. Vascular: Normal arterial flow voids Skull and upper cervical spine: Negative Sinuses/Orbits: Negative Other: None IMPRESSION: No acute abnormality. Atrophy and chronic ischemic changes as above. Electronically  Signed   By: Marlan Palauharles  Clark M.D.   On: 05/16/2018 20:00     Assessment/Plan:  78 y.o. female with a known history of hypertension, hyperlipidemia, history of stroke, dementia, history of PE who presented to the ED from [facility] with new onset of seizure.  Patient had a witnessed generalized tonic-clonic seizure x 2 yesterday and was post ictal.  . Patient has never had a seizure before in the past.  She does not know how long her seizures lasted today.  She denies any headache, vision changes, chest pain, palpitations, weakness, numbness, or tingling.  She denies any recent fevers or chills.  She denies any new medication changes recently. Now mentation is improved but is still confused.    - Unknown what true baseline is as pt does live in facility and has history of dementia - Currently following commands - MRI brain done and no  acute abnormality.  - Pt was started on Keppra 500 BID.  I agree on long term continuation of this medication as she does have history of strokes that could of provoked these seizures - EEG today and possibly d/c after the EEG.   Pauletta BrownsZEYLIKMAN, Liyat Faulkenberry  05/17/2018, 11:17 AM

## 2018-05-18 LAB — GLUCOSE, CAPILLARY: Glucose-Capillary: 101 mg/dL — ABNORMAL HIGH (ref 70–99)

## 2018-05-18 LAB — BASIC METABOLIC PANEL
Anion gap: 7 (ref 5–15)
BUN: 10 mg/dL (ref 8–23)
CO2: 23 mmol/L (ref 22–32)
Calcium: 8.9 mg/dL (ref 8.9–10.3)
Chloride: 109 mmol/L (ref 98–111)
Creatinine, Ser: 0.66 mg/dL (ref 0.44–1.00)
GFR calc Af Amer: >60 mL/min (ref >60)
GFR calc non Af Amer: >60 mL/min (ref >60)
Glucose, Bld: 81 mg/dL (ref 70–99)
Potassium: 3.5 mmol/L (ref 3.5–5.1)
Sodium: 139 mmol/L (ref 135–145)

## 2018-05-18 LAB — MAGNESIUM: Magnesium: 2.1 mg/dL (ref 1.7–2.4)

## 2018-05-18 MED ORDER — CEPHALEXIN 250 MG PO CAPS: 250.0000 mg | ORAL_CAPSULE | Freq: Three times a day (TID) | ORAL | 0 refills | Status: AC

## 2018-05-18 MED ORDER — LEVETIRACETAM 500 MG PO TABS: 500.0000 mg | ORAL_TABLET | Freq: Two times a day (BID) | ORAL | 2 refills | Status: AC

## 2018-05-18 NOTE — NC FL2 (Signed)
[place] MEDICAID FL2 LEVEL OF CARE SCREENING TOOL     IDENTIFICATION  Patient Name: Gabrielle FishCaroline Phifer Birthdate: 1940-11-04 Sex: female Admission Date (Current Location): 05/16/2018  [facility] [place]   Facility and Address:  [facility], [address], [place], [place] [address]      Provider Number: [id number]  Attending Physician Name and Address:  Enid BaasKalisetti, Radhika, MD  Relative Name and Phone Number:       Current Level of Care: Hospital Recommended Level of Care: Memory Care Prior Approval Number:    Date Approved/Denied:   PASRR Number:    Discharge Plan: Domiciliary (Rest home)(memory care )    Current Diagnoses: Primary Diagnosis: Dementia  Patient Active Problem List   Diagnosis Date Noted  . Seizures (HCC) 05/16/2018  . Urinary incontinence 11/19/2015  . Vitamin D deficiency   . Osteopenia   . Osteoarthritis   . 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 Hyperlipidemia   . Essential hypertension   . History of pulmonary embolus (PE)   . History of DVT (deep vein thrombosis)   . Dementia (HCC)     Orientation RESPIRATION BLADDER Height & Weight     Self  Normal Incontinent Weight: 160 lb (72.6 kg) Height:  5\' 7"  (170.2 cm)  BEHAVIORAL SYMPTOMS/MOOD NEUROLOGICAL BOWEL NUTRITION STATUS  (none) (none)   Diet(Heart Healthy )  AMBULATORY STATUS COMMUNICATION OF NEEDS Skin   Limited Assist Verbally Normal                       Personal Care Assistance Level of Assistance  Bathing, Feeding, Dressing Bathing Assistance: Limited assistance Feeding assistance: Independent Dressing Assistance: Limited assistance     Functional Limitations Info  Hearing, Sight, Speech Sight Info: Adequate Hearing Info: Adequate Speech Info: Adequate    SPECIAL CARE FACTORS FREQUENCY                       Contractures Contractures Info: Not present    Additional Factors Info  Code  Status, Allergies Code Status Info: Full Code  Allergies Info: Crestor Rosuvastatin, Humibid La Guaifenesin, Lipitor Atorvastatin, Macrolides And Ketolides, Septra Sulfamethoxazole-trimethoprim           TAKE these medications   acetaminophen 325 MG tablet Commonly known as:  TYLENOL Take 650 mg by mouth every 6 (six) hours as needed.   amLODipine 5 MG tablet Commonly known as:  NORVASC Take 5 mg by mouth daily.   aspirin 81 MG chewable tablet Chew 81 mg by mouth daily.   atorvastatin 40 MG tablet Commonly known as:  LIPITOR Take 1 tablet (40 mg total) by mouth daily.   cephALEXin 250 MG capsule Commonly known as:  KEFLEX Take 1 capsule (250 mg total) by mouth 3 (three) times daily for 3 days. Start taking on:  May 19, 2018   cholecalciferol 25 MCG (1000 UT) tablet Commonly known as:  VITAMIN D Take 1,000 Units by mouth daily.   donepezil 10 MG tablet Commonly known as:  ARICEPT Take 10 mg by mouth daily.   levETIRAcetam 500 MG tablet Commonly known as:  KEPPRA Take 1 tablet (500 mg total) by mouth 2 (two) times daily.   lisinopril 20 MG tablet Commonly known as:  ZESTRIL Take 20 mg by mouth daily.   memantine 10 MG tablet Commonly known as:  NAMENDA Take 10 mg by mouth 2 (two) times daily.   OVER  THE COUNTER MEDICATION 500 mg. Cranberry and a probiotic capsule.   sertraline 25 MG tablet Commonly known as:  ZOLOFT Take 25 mg by mouth daily.     Discharge Medications: Please see discharge summary for a list of discharge medications.  Relevant Imaging Results:  Relevant Lab Results:   Additional Information    Lamarr Feenstra  Rinaldo Ratel, [address]

## 2018-05-18 NOTE — Discharge Summary (Signed)
[facility] [place] [facility]   PATIENT NAME: Gabrielle Coleman    MR#:  [id number]  DATE OF BIRTH:  10/30/1940  DATE OF ADMISSION:  05/16/2018   ADMITTING PHYSICIAN: Campbell Stall, MD  DATE OF DISCHARGE: 05/18/18  PRIMARY CARE PHYSICIAN: Patient, No Pcp Per   ADMISSION DIAGNOSIS:   Seizure (HCC) [R56.9]  DISCHARGE DIAGNOSIS:   Active Problems:   Seizures (HCC)   SECONDARY DIAGNOSIS:   Past Medical History:  Diagnosis Date  . Dementia (HCC)   . History of pulmonary embolus (PE)   . HTN (hypertension)   . Hyperlipidemia   . 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 Osteoarthritis   . Osteopenia   . Vitamin D deficiency     HOSPITAL COURSE:   78 year old female with past medical history significant for dementia, prior history of stroke, history of PE presents from [facility] [place] with new onset grand mal seizures.  1.  Generalized tonic-clonic seizure-patient has underlying dementia and unable to provide more history. - has risk for seizures, given her history of stroke. -  According to granddaughter patient does have occasional jerking movements in the past. -Appreciate neurology consult.  MRI of the brain with and without contrast has remained negative -EEG is normal. -Patient started on Keppra.  will need long-term antiepileptic therapy at discharge  2.  Hypokalemia- corrected  3.  Acute cystitis- on Rocephin.  Urine cultures have been sent - discharge on keflex for 3 more days  4.  Hypertension-on lisinopril  5.  Dementia-continue Namenda and Aricept.  Also on Zoloft. Pleasantly confused.  Ambulated with PT Will be discharged back to ALF with home health today  DISCHARGE CONDITIONS:   Guarded  CONSULTS OBTAINED:   Neurology consult  DRUG ALLERGIES:   Allergies  Allergen Reactions  . Crestor [Rosuvastatin] Other (See Comments)    constipation  . Humibid La [Guaifenesin] Diarrhea and  Nausea And Vomiting  . Lipitor [Atorvastatin] Other (See Comments)    constipation  . Macrolides And Ketolides Nausea And Vomiting  . Septra [Sulfamethoxazole-Trimethoprim] Rash   DISCHARGE MEDICATIONS:   Allergies as of 05/18/2018      Reactions   Crestor [rosuvastatin] Other (See Comments)   constipation   Humibid La [guaifenesin] Diarrhea, Nausea And Vomiting   Lipitor [atorvastatin] Other (See Comments)   constipation   Macrolides And Ketolides Nausea And Vomiting   Septra [sulfamethoxazole-trimethoprim] Rash      Medication List    TAKE these medications   acetaminophen 325 MG tablet Commonly known as:  TYLENOL Take 650 mg by mouth every 6 (six) hours as needed.   amLODipine 5 MG tablet Commonly known as:  NORVASC Take 5 mg by mouth daily.   aspirin 81 MG chewable tablet Chew 81 mg by mouth daily.   atorvastatin 40 MG tablet Commonly known as:  LIPITOR Take 1 tablet (40 mg total) by mouth daily.   cephALEXin 250 MG capsule Commonly known as:  KEFLEX Take 1 capsule (250 mg total) by mouth 3 (three) times daily for 3 days. Start taking on:  May 19, 2018   cholecalciferol 25 MCG (1000 UT) tablet Commonly known as:  VITAMIN D Take 1,000 Units by mouth daily.   donepezil 10 MG tablet Commonly known as:  ARICEPT Take 10 mg by mouth daily.   levETIRAcetam 500 MG tablet Commonly known as:  KEPPRA Take 1 tablet (500 mg total) by mouth 2 (two) times daily.  lisinopril 20 MG tablet Commonly known as:  ZESTRIL Take 20 mg by mouth daily.   memantine 10 MG tablet Commonly known as:  NAMENDA Take 10 mg by mouth 2 (two) times daily.   OVER THE COUNTER MEDICATION 500 mg. Cranberry and a probiotic capsule.   sertraline 25 MG tablet Commonly known as:  ZOLOFT Take 25 mg by mouth daily.        DISCHARGE INSTRUCTIONS:   1. PCP f/u in 1-2 weeks  DIET:   Cardiac diet  ACTIVITY:   Activity as tolerated  OXYGEN:   Home Oxygen: No.  Oxygen Delivery:  room air  DISCHARGE LOCATION:   Assisted Living-  [facility]  If you experience worsening of your admission symptoms, develop shortness of breath, life threatening emergency, suicidal or homicidal thoughts you must seek medical attention immediately by calling 911 or calling your MD immediately  if symptoms less severe.  You Must read complete instructions/literature along with all the possible adverse reactions/side effects for all the Medicines you take and that have been prescribed to you. Take any new Medicines after you have completely understood and accpet all the possible adverse reactions/side effects.   Please note  You were cared for by a hospitalist during your hospital stay. If you have any questions about your discharge medications or the care you received while you were in the hospital after you are discharged, you can call the unit and asked to speak with the hospitalist on call if the hospitalist that took care of you is not available. Once you are discharged, your primary care physician will handle any further medical issues. Please note that NO REFILLS for any discharge medications will be authorized once you are discharged, as it is imperative that you return to your primary care physician (or establish a relationship with a primary care physician if you do not have one) for your aftercare needs so that they can reassess your need for medications and monitor your lab values.    On the day of Discharge:  VITAL SIGNS:   Blood pressure (!) 145/67, pulse 70, temperature 97.6 F (36.4 C), temperature source Oral, resp. rate 17, height 5\' 7"  (1.702 m), weight 72.6 kg, SpO2 98 %.  PHYSICAL EXAMINATION:    GENERAL:  78 y.o.-year-old thin built, ill nourished, patient lying in the bed with no acute distress.  EYES: Pupils equal, round, reactive to light and accommodation. No scleral icterus. Extraocular muscles intact.  HEENT: Head atraumatic, normocephalic.  Oropharynx and nasopharynx clear.  NECK:  Supple, no jugular venous distention. No thyroid enlargement, no tenderness.  LUNGS: Normal breath sounds bilaterally, no wheezing, rales,rhonchi or crepitation. No use of accessory muscles of respiration.  Decreased bibasilar breath sounds CARDIOVASCULAR: S1, S2 normal. No murmurs, rubs, or gallops.  ABDOMEN: Soft, nontender, nondistended. Bowel sounds present. No organomegaly or mass.  EXTREMITIES: No pedal edema, cyanosis, or clubbing.  NEUROLOGIC: Cranial nerves II through XII are intact. Muscle strength 5/5 in all extremities. Sensation intact. Gait not checked.  PSYCHIATRIC: The patient is alert and oriented to self SKIN: No obvious rash, lesion, or ulcer.    DATA REVIEW:   CBC Recent Labs  Lab 05/17/18 0444  WBC 4.2  HGB 12.1  HCT 37.5  PLT 188    Chemistries  Recent Labs  Lab 05/18/18 0555  NA 139  K 3.5  CL 109  CO2 23  GLUCOSE 81  BUN 10  CREATININE 0.66  CALCIUM 8.9  MG 2.1  Microbiology Results  Results for orders placed or performed during the hospital encounter of 05/16/18  SARS Coronavirus 2 (CEPHEID - Performed in [facility] hospital lab), Hosp Order     Status: None   Collection Time: 05/16/18  1:28 PM  Result Value Ref Range Status   SARS Coronavirus 2 NEGATIVE NEGATIVE Final    Comment: (NOTE) If result is NEGATIVE SARS-CoV-2 target nucleic acids are NOT DETECTED. The SARS-CoV-2 RNA is generally detectable in upper and lower  respiratory specimens during the acute phase of infection. The lowest  concentration of SARS-CoV-2 viral copies this assay can detect is 250  copies / mL. A negative result does not preclude SARS-CoV-2 infection  and should not be used as the sole basis for treatment or other  patient management decisions.  A negative result may occur with  improper specimen collection / handling, submission of specimen other  than nasopharyngeal swab, presence of viral mutation(s) within the   areas targeted by this assay, and inadequate number of viral copies  (<250 copies / mL). A negative result must be combined with clinical  observations, patient history, and epidemiological information. If result is POSITIVE SARS-CoV-2 target nucleic acids are DETECTED. The SARS-CoV-2 RNA is generally detectable in upper and lower  respiratory specimens dur ing the acute phase of infection.  Positive  results are indicative of active infection with SARS-CoV-2.  Clinical  correlation with patient history and other diagnostic information is  necessary to determine patient infection status.  Positive results do  not rule out bacterial infection or co-infection with other viruses. If result is PRESUMPTIVE POSTIVE SARS-CoV-2 nucleic acids MAY BE PRESENT.   A presumptive positive result was obtained on the submitted specimen  and confirmed on repeat testing.  While 2019 novel coronavirus  (SARS-CoV-2) nucleic acids may be present in the submitted sample  additional confirmatory testing may be necessary for epidemiological  and / or clinical management purposes  to differentiate between  SARS-CoV-2 and other Sarbecovirus currently known to infect humans.  If clinically indicated additional testing with an alternate test  methodology [phone]) is advised. The SARS-CoV-2 RNA is generally  detectable in upper and lower respiratory sp ecimens during the acute  phase of infection. The expected result is Negative. Fact Sheet for Patients:  BoilerBrush.com.cy Fact Sheet for Healthcare Providers: https://pope.com/ This test is not yet approved or cleared by the [place] FDA and has been authorized for detection and/or diagnosis of SARS-CoV-2 by FDA under an Emergency Use Authorization (EUA).  This EUA will remain in effect (meaning this test can be used) for the duration of the COVID-19 declaration under Section 564(b)(1) of the Act, 21 U.S.C.  section 360bbb-3(b)(1), unless the authorization is terminated or revoked sooner. Performed at [facility], [address] [place], [place] [address]   MRSA PCR Screening     Status: None   Collection Time: 05/16/18  6:02 PM  Result Value Ref Range Status   MRSA by PCR NEGATIVE NEGATIVE Final    Comment:        The GeneXpert MRSA Assay (FDA approved for NASAL specimens only), is one component of a comprehensive MRSA colonization surveillance program. It is not intended to diagnose MRSA infection nor to guide or monitor treatment for MRSA infections. Performed at [facility], [address], [place], [place] [address]   Urine Culture     Status: None (Preliminary result)   Collection Time: 05/16/18  6:10 PM  Result Value Ref Range Status  Specimen Description   Final    URINE, RANDOM Performed at [facility], [address] [place], [place] [address]    Special Requests   Final    NONE Performed at [facility], [address] [place], [place] [address]    Culture   Final    CULTURE REINCUBATED FOR BETTER GROWTH Performed at [facility], [address] [place], [place] [address]    Report Status PENDING  Incomplete    RADIOLOGY:  No results found.   Management plans discussed with the patient, family and they are in agreement.  CODE STATUS:     Code Status Orders  (From admission, onward)         Start     Ordered   05/16/18 1718  Full code  Continuous     05/16/18 1717        Code Status History    This patient has a current code status but no historical code status.    Advance Directive Documentation     Most Recent Value  Type of Advance Directive  Out of facility DNR (pink MOST or yellow form)  Pre-existing out of facility DNR order (yellow form or pink MOST form)  Yellow form placed in chart (order not valid for inpatient use)  "MOST" Form in Place?  -      TOTAL TIME TAKING CARE OF THIS  PATIENT: 38 minutes.    Enid Baasadhika Zacari Radick M.D on 05/18/2018 at 10:50 AM  Between 7am to 6pm - Pager - [phone]  After 6pm go to [email or web address] - Social research officer, governmentpassword [email or web address] [email or web address]  [facility] [place] [facility]  Office  [phone]  CC: Primary care physician; Patient, No Pcp Per   Note: This dictation was prepared with Dragon dictation along with smaller phrase technology. Any transcriptional errors that result from this process are unintentional.

## 2018-05-18 NOTE — TOC Transition Note (Signed)
Transition of Care [facility]) - CM/SW Discharge Note   Patient Details  Name: Gabrielle Coleman MRN: [id number] Date of Birth: 04-10-1940  [facility] CM/SW Contact:  Ruthe Mannan, LCSWA Phone Number: 05/18/2018, 12:28 PM   Clinical Narrative:   Patient is medically ready for discharge today. [facility]. CSW notified patient's granddaughter of discharge today. CSW also notified Kendal Hymen at [facility] who states that they will pick up patient today around 2 pm. CSW completed DC packet and notified RN of above.     Final next level of care: Memory Care Barriers to Discharge: No Barriers Identified   Patient Goals and CMS Choice Patient states their goals for this hospitalization and ongoing recovery are:: Return to Memory Care  CMS Medicare.gov Compare Post Acute Care list provided to:: Patient Represenative (must comment)(Granddaughter Joni Reining ) Choice offered to / list presented to : [facility] POA / Guardian  Discharge Placement                Patient to be transferred to facility by: Facility Representative  Name of family member notified: Wende Mott  Patient and family notified of of transfer: 05/18/18  Discharge Plan and Services                                     Social Determinants of Health (SDOH) Interventions     Readmission Risk Interventions No flowsheet data found.

## 2018-05-18 NOTE — Progress Notes (Signed)
MEDICATION RELATED CONSULT NOTE - INITIAL   Pharmacy Consult for Monitoring AED for DDI Indication: New Onset Seizures  Allergies  Allergen Reactions  . Crestor [Rosuvastatin] Other (See Comments)    constipation  . Humibid La [Guaifenesin] Diarrhea and Nausea And Vomiting  . Lipitor [Atorvastatin] Other (See Comments)    constipation  . Macrolides And Ketolides Nausea And Vomiting  . Septra [Sulfamethoxazole-Trimethoprim] Rash    Patient Measurements: Height: 5\' 7"  (170.2 cm) Weight: 160 lb (72.6 kg) IBW/kg (Calculated) : 61.6 Adjusted Body Weight:    Vital Signs: Temp: 97.6 F (36.4 C) (05/15 0747) Temp Source: Oral (05/15 0747) BP: 145/67 (05/15 0747) Pulse Rate: 70 (05/15 0401) Intake/Output from previous day: 05/14 0701 - 05/15 0700 In: 1759.2 [P.O.:120; I.V.:1539.2; IV Piggyback:100] Out: 850 [Urine:850] Intake/Output from this shift: Total I/O In: 240 [P.O.:240] Out: 1000 [Urine:1000]  Labs: Recent Labs    05/16/18 1249 05/17/18 0444 05/18/18 0555  WBC 6.6 4.2  --   HGB 13.7 12.1  --   HCT 41.0 37.5  --   PLT 228 188  --   CREATININE 0.87 0.64 0.66  MG  --   --  2.1   Estimated Creatinine Clearance: 55.5 mL/min (by C-G formula based on SCr of 0.66 mg/dL).   Assessment: Patient is a 78yo female admitted for new onset seizure. Pharmacy consulted to monitor AED for DDI. Patient was initiated on Levetiracetam 500mg  PO BID.  Plan:  Reviewed medications for interactions, none noted. Renal function acceptable to current dose. Will continue to follow.  Albina Billet, PharmD, BCPS Clinical Pharmacist 05/18/2018 10:17 AM

## 2018-05-19 LAB — URINE CULTURE: Culture: 80000 — AB

## 2018-12-01 ENCOUNTER — Other Ambulatory Visit: Payer: Self-pay

## 2018-12-01 ENCOUNTER — Emergency Department: Payer: Medicare Other

## 2018-12-01 ENCOUNTER — Emergency Department
Admission: EM | Admit: 2018-12-01 | Discharge: 2018-12-01 | Disposition: A | Discharge status: Home / Self Care | Payer: Medicare Other | Attending: Emergency Medicine | Admitting: Emergency Medicine

## 2018-12-01 DIAGNOSIS — S0101XA Laceration without foreign body of scalp, initial encounter: Secondary | ICD-10-CM | POA: Diagnosis not present

## 2018-12-01 DIAGNOSIS — Z7982 Long term (current) use of aspirin: Secondary | ICD-10-CM | POA: Diagnosis not present

## 2018-12-01 DIAGNOSIS — S0990XA Unspecified injury of head, initial encounter: Secondary | ICD-10-CM | POA: Diagnosis present

## 2018-12-01 DIAGNOSIS — Y939 Activity, unspecified: Secondary | ICD-10-CM | POA: Insufficient documentation

## 2018-12-01 DIAGNOSIS — Z79899 Other long term (current) drug therapy: Secondary | ICD-10-CM | POA: Diagnosis not present

## 2018-12-01 DIAGNOSIS — F0390 Unspecified dementia without behavioral disturbance: Secondary | ICD-10-CM | POA: Diagnosis not present

## 2018-12-01 DIAGNOSIS — Y999 Unspecified external cause status: Secondary | ICD-10-CM | POA: Insufficient documentation

## 2018-12-01 DIAGNOSIS — I10 Essential (primary) hypertension: Secondary | ICD-10-CM | POA: Insufficient documentation

## 2018-12-01 DIAGNOSIS — Y92122 Bedroom in nursing home as the place of occurrence of the external cause: Secondary | ICD-10-CM | POA: Insufficient documentation

## 2018-12-01 DIAGNOSIS — W01190A Fall on same level from slipping, tripping and stumbling with subsequent striking against furniture, initial encounter: Secondary | ICD-10-CM | POA: Diagnosis not present

## 2018-12-01 DIAGNOSIS — W19XXXA Unspecified fall, initial encounter: Secondary | ICD-10-CM

## 2018-12-01 LAB — CBC
HCT: 39.7 % (ref 36.0–46.0)
Hemoglobin: 13.3 g/dL (ref 12.0–15.0)
MCH: 31.7 pg (ref 26.0–34.0)
MCHC: 33.5 g/dL (ref 30.0–36.0)
MCV: 94.7 fL (ref 80.0–100.0)
Platelets: 218 10*3/uL (ref 150–400)
RBC: 4.19 MIL/uL (ref 3.87–5.11)
RDW: 13.2 % (ref 11.5–15.5)
WBC: 4.5 10*3/uL (ref 4.0–10.5)
nRBC: 0 % (ref 0.0–0.2)

## 2018-12-01 LAB — BASIC METABOLIC PANEL
Anion gap: 10 (ref 5–15)
BUN: 15 mg/dL (ref 8–23)
CO2: 27 mmol/L (ref 22–32)
Calcium: 9 mg/dL (ref 8.9–10.3)
Chloride: 105 mmol/L (ref 98–111)
Creatinine, Ser: 0.84 mg/dL (ref 0.44–1.00)
GFR calc Af Amer: >60 mL/min (ref >60)
GFR calc non Af Amer: >60 mL/min (ref >60)
Glucose, Bld: 88 mg/dL (ref 70–99)
Potassium: 3.3 mmol/L — ABNORMAL LOW (ref 3.5–5.1)
Sodium: 142 mmol/L (ref 135–145)

## 2018-12-01 NOTE — ED Notes (Signed)
MD at bedside to suture pt facial lac.

## 2018-12-01 NOTE — ED Provider Notes (Signed)
[facility] Emergency Department Provider Note   ____________________________________________    I have reviewed the triage vital signs and the nursing notes.   HISTORY  Chief Complaint Fall   History limited by dementia  HPI Gabrielle Coleman is a 78 y.o. female who presents from memory care unit at [facility] after a fall.  She states "I fell when I got up "but she is unable to tell me if she was dizzy or had a mechanical fall.  She apparently hit her head on her nightstand.  She denies neck pain.  She denies back pain.  She denies abdominal pain.  No chest wall pain.  Moving all extremities well.  History of PE but not anticoagulated,  Past Medical History:  Diagnosis Date  . Dementia ([place])   . History of pulmonary embolus (PE)   . HTN (hypertension)   . Hyperlipidemia   . 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 Osteoarthritis   . Osteopenia   . Vitamin D deficiency     Patient Active Problem List   Diagnosis Date Noted  . Seizures ([place]) 05/16/2018  . Urinary incontinence 11/19/2015  . Vitamin D deficiency   . Osteopenia   . Osteoarthritis   . 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 Hyperlipidemia   . Essential hypertension   . History of pulmonary embolus (PE)   . History of DVT (deep vein thrombosis)   . Dementia [employer])     Past Surgical History:  Procedure Laterality Date  . APPENDECTOMY    . DILATION AND CURETTAGE OF UTERUS    . FEMORAL HERNIA REPAIR Right   . LUMBAR FUSION     L4-L5  . TOTAL HIP ARTHROPLASTY Right   . TUBAL LIGATION      Prior to Admission medications   Medication Sig Start Date End Date Taking? Authorizing Provider  acetaminophen (TYLENOL) 325 MG tablet Take 650 mg by mouth every 6 (six) hours as needed.    [provider]  amLODipine (NORVASC) 5 MG tablet Take 5 mg by mouth daily. 02/02/17   [provider]  aspirin 81 MG chewable tablet Chew 81 mg by  mouth daily.    [provider]  atorvastatin (LIPITOR) 40 MG tablet Take 1 tablet (40 mg total) by mouth daily. 11/17/15   Coral Spikes, DO  cholecalciferol (VITAMIN D) 25 MCG (1000 UT) tablet Take 1,000 Units by mouth daily.    [provider]  donepezil (ARICEPT) 10 MG tablet Take 10 mg by mouth daily.    [provider]  levETIRAcetam (KEPPRA) 500 MG tablet Take 1 tablet (500 mg total) by mouth 2 (two) times daily. 05/18/18   Gladstone Lighter, MD  lisinopril (PRINIVIL,ZESTRIL) 20 MG tablet Take 20 mg by mouth daily. 02/02/17   [provider]  memantine (NAMENDA) 10 MG tablet Take 10 mg by mouth 2 (two) times daily.    [provider]  OVER THE COUNTER MEDICATION 500 mg. Cranberry and a probiotic capsule.    [provider]  sertraline (ZOLOFT) 25 MG tablet Take 25 mg by mouth daily.    [provider]     Allergies Crestor [rosuvastatin], Humibid la [guaifenesin], Lipitor [atorvastatin], Macrolides and ketolides, and Septra [sulfamethoxazole-trimethoprim]  Family History  Problem Relation Age of Onset  . Lung cancer Mother   . Hypertension Mother   . Heart disease Mother   . Prostate cancer Father   . Stroke  Father   . Diabetes Mellitus II Sister   . Diabetes Mellitus II Brother   . Heart disease Brother   . Hypertension Son   . Liver disease Daughter     Social History Social History   Tobacco Use  . Smoking status: Never Smoker  . Smokeless tobacco: Never Used  Substance Use Topics  . Alcohol use: No  . Drug use: No    Unable to obtain accurate review of Systems due to dementia     ____________________________________________   PHYSICAL EXAM:  VITAL SIGNS: ED Triage Vitals  Enc Vitals Group     BP 12/01/18 0654 (!) 158/78     Pulse Rate 12/01/18 0654 74     Resp --      Temp 12/01/18 0654 98.1 F (36.7 C)     Temp Source 12/01/18 0654 Oral     SpO2 12/01/18 0654 99 %     Weight 12/01/18  0655 65.8 kg (145 lb)     Height 12/01/18 0655 1.626 m (5\' 4" )     Head Circumference --      Peak Flow --      Pain Score 12/01/18 0655 10     Pain Loc --      Pain Edu? --      Excl. in GC? --     Constitutional: Alert, no acute distress Eyes: No bruising or swelling to the orbits Head: Large L-shaped laceration to the left forehead, bleeding controlled Nose: No epistaxis Mouth/Throat: No pharyngeal swelling or dental pain Neck:  Painless ROM, no pain with axial load, no vertebral tenderness to palpation, Cardiovascular: Normal rate, regular rhythm.  Good peripheral circulation.  No chest wall tenderness to palpation, clavicles normal Respiratory: Normal respiratory effort.  No retractions. Lungs CTAB. Gastrointestinal: Soft and nontender. No distention.  No CVA tenderness.  Musculoskeletal: No pain with axial load on both hips, full range of motion of all extremities without pain, warm and well perfused Neurologic:  Normal speech and language. No gross focal neurologic deficits are appreciated.  Skin:  Skin is warm, dry, laceration as above Psychiatric: Speech and behavior are normal.  ____________________________________________   LABS (all labs ordered are listed, but only abnormal results are displayed)  Labs Reviewed  BASIC METABOLIC PANEL - Abnormal; Notable for the following components:      Result Value   Potassium 3.3 (*)    All other components within normal limits  CBC   ____________________________________________  EKG  ED ECG REPORT I, 12/03/18, the attending physician, personally viewed and interpreted this ECG.  Date: 12/01/2018  Rhythm: normal sinus rhythm QRS Axis: normal Intervals: normal ST/T Wave abnormalities: normal Narrative Interpretation: no evidence of acute ischemia  ____________________________________________  RADIOLOGY  CT head no acute injury ____________________________________________   PROCEDURES  Procedure(s)  performed: yes  .11/30/2020Laceration Repair  Date/Time: 12/01/2018 8:49 AM Performed by: 12/03/2018, MD Authorized by: Jene Every, MD   Consent:    Consent obtained:  Emergent situation   Consent given by:  Patient   Risks discussed:  Infection, poor wound healing and pain Anesthesia (see MAR for exact dosages):    Anesthesia method:  Local infiltration   Local anesthetic:  Lidocaine 1% w/o epi Laceration details:    Location:  Scalp   Scalp location:  Frontal   Length (cm):  15 Repair type:    Repair type:  Complex Pre-procedure details:    Preparation:  Patient was prepped and draped in usual sterile fashion  Exploration:    Limited defect created (wound extended): no     Hemostasis achieved with:  Direct pressure   Wound exploration: entire depth of wound probed and visualized     Contaminated: no   Treatment:    Area cleansed with:  Saline   Amount of cleaning:  Extensive   Irrigation solution:  Sterile saline   Irrigation method:  Syringe   Visualized foreign bodies/material removed: no     Undermining:  Minimal   Scar revision: no   Skin repair:    Repair method:  Sutures   Suture size:  5-0   Suture material:  Nylon   Suture technique:  Simple interrupted   Number of sutures:  15 Approximation:    Approximation:  Close Post-procedure details:    Dressing:  Sterile dressing   Patient tolerance of procedure:  Tolerated well, no immediate complications     Critical Care performed: No ____________________________________________   INITIAL IMPRESSION / ASSESSMENT AND PLAN / ED COURSE  Pertinent labs & imaging results that were available during my care of the patient were reviewed by me and considered in my medical decision making (see chart for details).  Patient presents after fall, EKG is reassuring, vital signs here are unremarkable.  Will check labs, CT head given head injury.  Will require laceration repair     ____________________________________________   FINAL CLINICAL IMPRESSION(S) / ED DIAGNOSES  Final diagnoses:  Fall, initial encounter  Injury of head, initial encounter  Laceration of scalp, initial encounter        Note:  This document was prepared using Dragon voice recognition software and may include unintentional dictation errors.   Jene EveryKinner, Wilmer Santillo, MD 12/01/18 [phone]

## 2018-12-01 NOTE — ED Triage Notes (Signed)
Pt arrived via [facility] from [facility] with a laceration on left side of head.  Unwitnessed fall onto corner of night stand.  Denies LOC or dizziness. Pt is alert and oriented.  EMS:  97% on RA, 98.75f, 77HR, 151/75 BP.

## 2018-12-01 NOTE — ED Notes (Signed)
Fall bracelet and yellow socks placed.

## 2018-12-01 NOTE — ED Notes (Signed)
Pt taken to CT via stretcher.

## 2018-12-01 NOTE — ED Notes (Signed)
Called [facility] for transport to [facility]

## 2018-12-01 NOTE — Discharge Instructions (Addendum)
CT head unremarkable, suture removal in 5 to 7 days, please change dressing daily

## 2018-12-01 NOTE — ED Notes (Addendum)
Called [facility] who stated they don't provide transportation on the weekends and pt will have to come back by [facility]. Pt from memory care.

## 2018-12-01 NOTE — ED Notes (Signed)
EMS at bedside to take pt back to [place]. D/t pt hx dementia unable to sign for DC. Pt had pulled off bandage over sutures, refused to have it placed back on. Refused to keep mask on to EMS.

## 2018-12-04 ENCOUNTER — Emergency Department
Admission: EM | Admit: 2018-12-04 | Discharge: 2018-12-04 | Disposition: A | Discharge status: Skilled Nursing Facility | Payer: Medicare Other | Attending: Emergency Medicine | Admitting: Emergency Medicine

## 2018-12-04 DIAGNOSIS — Y829 Unspecified medical devices associated with adverse incidents: Secondary | ICD-10-CM | POA: Insufficient documentation

## 2018-12-04 DIAGNOSIS — F424 Excoriation (skin-picking) disorder: Secondary | ICD-10-CM | POA: Diagnosis not present

## 2018-12-04 DIAGNOSIS — Z79899 Other long term (current) drug therapy: Secondary | ICD-10-CM | POA: Diagnosis not present

## 2018-12-04 DIAGNOSIS — I10 Essential (primary) hypertension: Secondary | ICD-10-CM | POA: Diagnosis not present

## 2018-12-04 DIAGNOSIS — Z4801 Encounter for change or removal of surgical wound dressing: Secondary | ICD-10-CM | POA: Diagnosis present

## 2018-12-04 DIAGNOSIS — R410 Disorientation, unspecified: Secondary | ICD-10-CM | POA: Insufficient documentation

## 2018-12-04 DIAGNOSIS — T8131XA Disruption of external operation (surgical) wound, not elsewhere classified, initial encounter: Secondary | ICD-10-CM | POA: Diagnosis not present

## 2018-12-04 DIAGNOSIS — F0390 Unspecified dementia without behavioral disturbance: Secondary | ICD-10-CM | POA: Diagnosis not present

## 2018-12-04 DIAGNOSIS — Z5189 Encounter for other specified aftercare: Secondary | ICD-10-CM

## 2018-12-04 DIAGNOSIS — Z7982 Long term (current) use of aspirin: Secondary | ICD-10-CM | POA: Insufficient documentation

## 2018-12-04 DIAGNOSIS — T8130XA Disruption of wound, unspecified, initial encounter: Secondary | ICD-10-CM

## 2018-12-04 MED ORDER — DOXYCYCLINE HYCLATE 100 MG PO TABS
100.0000 mg | ORAL_TABLET | Freq: Once | ORAL | Status: AC
  Administered 2018-12-04: 100 mg via ORAL
  Filled 2018-12-04: qty 1

## 2018-12-04 MED ORDER — DOXYCYCLINE HYCLATE 100 MG PO CAPS: 100.0000 mg | ORAL_CAPSULE | Freq: Two times a day (BID) | ORAL | 0 refills | Status: AC

## 2018-12-04 NOTE — ED Triage Notes (Addendum)
Pt to triage via w/c with no distress noted, mask in place; pt alert to self only; pt denies any c/o and is unable to verbalize why she is here; hx dementia and is alone (unable to complete all triage/screening questions); 1st nurse reports pt was brought in by EMS because the pt was "picking at stitches"; large bandaid in place to left side forehead; sutures intact to wound; 1 small area (approx 1/4-1/2" that is separated); no bleeding noted

## 2018-12-04 NOTE — ED Provider Notes (Signed)
[facility] Emergency Department Provider Note  ____________________________________________   None    (approximate)  I have reviewed the triage vital signs and the nursing notes.   HISTORY  Chief Complaint Wound Check    HPI Gabrielle Coleman is a 78 y.o. female  With h/o dementia, h/o PE, here with laceration to upper scalp. Pt just seen for fall, ahs reporteldy been picking @ her scab and was sent here for wound check. No new falls. No other complaints.   Level 5 caveat invoked as remainder of history, ROS, and physical exam limited due to patient's dementia.        Past Medical History:  Diagnosis Date  . Dementia (HCC)   . History of pulmonary embolus (PE)   . HTN (hypertension)   . Hyperlipidemia   . 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 Osteoarthritis   . Osteopenia   . Vitamin D deficiency     Patient Active Problem List   Diagnosis Date Noted  . Seizures (HCC) 05/16/2018  . Urinary incontinence 11/19/2015  . Vitamin D deficiency   . Osteopenia   . Osteoarthritis   . 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 Hyperlipidemia   . Essential hypertension   . History of pulmonary embolus (PE)   . History of DVT (deep vein thrombosis)   . Dementia [facility])     Past Surgical History:  Procedure Laterality Date  . APPENDECTOMY    . DILATION AND CURETTAGE OF UTERUS    . FEMORAL HERNIA REPAIR Right   . LUMBAR FUSION     L4-L5  . TOTAL HIP ARTHROPLASTY Right   . TUBAL LIGATION      Prior to Admission medications   Medication Sig Start Date End Date Taking? Authorizing Provider  acetaminophen (TYLENOL) 325 MG tablet Take 650 mg by mouth every 6 (six) hours as needed.    [provider]  amLODipine (NORVASC) 5 MG tablet Take 5 mg by mouth daily. 02/02/17   [provider]  aspirin 81 MG chewable tablet Chew 81 mg by mouth daily.    [provider]  atorvastatin (LIPITOR)  40 MG tablet Take 1 tablet (40 mg total) by mouth daily. 11/17/15   Tommie Sams, DO  cholecalciferol (VITAMIN D) 25 MCG (1000 UT) tablet Take 1,000 Units by mouth daily.    [provider]  donepezil (ARICEPT) 10 MG tablet Take 10 mg by mouth daily.    [provider]  levETIRAcetam (KEPPRA) 500 MG tablet Take 1 tablet (500 mg total) by mouth 2 (two) times daily. 05/18/18   Enid Baas, MD  lisinopril (PRINIVIL,ZESTRIL) 20 MG tablet Take 20 mg by mouth daily. 02/02/17   [provider]  memantine (NAMENDA) 10 MG tablet Take 10 mg by mouth 2 (two) times daily.    [provider]  OVER THE COUNTER MEDICATION 500 mg. Cranberry and a probiotic capsule.    [provider]  sertraline (ZOLOFT) 25 MG tablet Take 25 mg by mouth daily.    [provider]    Allergies Crestor [rosuvastatin], Humibid la [guaifenesin], Lipitor [atorvastatin], Macrolides and ketolides, and Septra [sulfamethoxazole-trimethoprim]  Family History  Problem Relation Age of Onset  . Lung cancer Mother   . Hypertension Mother   . Heart disease Mother   . Prostate cancer Father   . Stroke Father   . Diabetes Mellitus II Sister   . Diabetes Mellitus II Brother   .  Heart disease Brother   . Hypertension Son   . Liver disease Daughter     Social History Social History   Tobacco Use  . Smoking status: Never Smoker  . Smokeless tobacco: Never Used  Substance Use Topics  . Alcohol use: No  . Drug use: No    Review of Systems  Review of Systems  Unable to perform ROS: Dementia  Skin: Positive for wound.     ____________________________________________  PHYSICAL EXAM:      VITAL SIGNS: ED Triage Vitals  Enc Vitals Group     BP 12/04/18 2058 (!) 144/79     Pulse Rate 12/04/18 2058 79     Resp 12/04/18 2058 18     Temp 12/04/18 2058 98 F (36.7 C)     Temp Source 12/04/18 2058 Oral     SpO2 12/04/18 2058 98 %     Weight --      Height --       Head Circumference --      Peak Flow --      Pain Score 12/04/18 2059 0     Pain Loc --      Pain Edu? --      Excl. in GC? --      Physical Exam Vitals signs and nursing note reviewed.  Constitutional:      General: She is not in acute distress.    Appearance: She is well-developed.  HENT:     Head: Normocephalic.     Comments: Irregular laceration to anterior superior forehead. Sutures in place. Superior linear wound with approx 2 cm area of suture dehiscence. Minimal wound edge erythema w/o surrounding induration or fluctuance. No surrounding TTP. No purulence. Eyes:     Conjunctiva/sclera: Conjunctivae normal.  Neck:     Musculoskeletal: Neck supple.  Cardiovascular:     Rate and Rhythm: Normal rate and regular rhythm.     Heart sounds: Normal heart sounds.  Pulmonary:     Effort: Pulmonary effort is normal. No respiratory distress.     Breath sounds: No wheezing.  Abdominal:     General: There is no distension.  Skin:    General: Skin is warm.     Capillary Refill: Capillary refill takes less than 2 seconds.     Findings: No rash.  Neurological:     Mental Status: She is alert. She is disoriented.     Motor: No abnormal muscle tone.       ____________________________________________   LABS (all labs ordered are listed, but only abnormal results are displayed)  Labs Reviewed - No data to display  ____________________________________________  EKG: none ________________________________________  RADIOLOGY All imaging, including plain films, CT scans, and ultrasounds, independently reviewed by me, and interpretations confirmed via formal radiology reads.  ED MD interpretation:   none  Official radiology report(s): No results found.  ____________________________________________  PROCEDURES   Procedure(s) performed (including Critical Care):  Procedures  ____________________________________________  INITIAL IMPRESSION / MDM / ASSESSMENT AND PLAN /  ED COURSE  As part of my medical decision making, I reviewed the following data within the electronic MEDICAL RECORD NUMBER Nursing notes reviewed and incorporated, Old chart reviewed, Notes from prior ED visits, and [place] Controlled Substance Database       *Gabrielle Coleman was evaluated in Emergency Department on 12/04/2018 for the symptoms described in the history of present illness. She was evaluated in the context of the global COVID-19 pandemic, which necessitated consideration that the patient might be  at risk for infection with the SARS-CoV-2 virus that causes COVID-19. Institutional protocols and algorithms that pertain to the evaluation of patients at risk for COVID-19 are in a state of rapid change based on information released by regulatory bodies including the CDC and federal and state organizations. These policies and algorithms were followed during the patient's care in the ED.  Some ED evaluations and interventions may be delayed as a result of limited staffing during the pandemic.*     Medical Decision Making:  Lac with superficial dehiscence likely 2/2 skin picking. Minimal erythema. No purulence. No other complications. Xeroform with wet to dry dressing ordered, will d/c with doxy for infection prevention and outpt follow-up.  ____________________________________________  FINAL CLINICAL IMPRESSION(S) / ED DIAGNOSES  Final diagnoses:  Visit for wound check  Wound dehiscence     MEDICATIONS GIVEN DURING THIS VISIT:  Medications  doxycycline (VIBRA-TABS) tablet 100 mg (100 mg Oral Given 12/04/18 2211)     ED Discharge Orders    None       Note:  This document was prepared using Dragon voice recognition software and may include unintentional dictation errors.   Duffy Bruce, MD 12/04/18 2326

## 2018-12-04 NOTE — ED Notes (Signed)
Unable to obtain e-signature at discharge d/t h/x of dementia.

## 2018-12-04 NOTE — Discharge Instructions (Addendum)
We are going to start an antibiotic to prevent infection  The wound needs to be COVERED in Xeroform/non stick dressing and antibiotic ointment, then with a LARGE DRY DRESSING to prevent picking.  Follow-up with primary doctor in 3 days for wound check  Consider mittens to prevent scratching at the wound until healed

## 2018-12-12 ENCOUNTER — Other Ambulatory Visit: Payer: Self-pay

## 2018-12-12 ENCOUNTER — Emergency Department: Payer: Medicare Other

## 2018-12-12 ENCOUNTER — Emergency Department
Admission: EM | Admit: 2018-12-12 | Discharge: 2018-12-13 | Disposition: A | Discharge status: Home / Self Care | Payer: Medicare Other | Attending: Emergency Medicine | Admitting: Emergency Medicine

## 2018-12-12 DIAGNOSIS — M25551 Pain in right hip: Secondary | ICD-10-CM | POA: Insufficient documentation

## 2018-12-12 DIAGNOSIS — Z96641 Presence of right artificial hip joint: Secondary | ICD-10-CM | POA: Diagnosis not present

## 2018-12-12 DIAGNOSIS — F0390 Unspecified dementia without behavioral disturbance: Secondary | ICD-10-CM | POA: Diagnosis not present

## 2018-12-12 DIAGNOSIS — Z86711 Personal history of pulmonary embolism: Secondary | ICD-10-CM | POA: Diagnosis not present

## 2018-12-12 DIAGNOSIS — I10 Essential (primary) hypertension: Secondary | ICD-10-CM | POA: Diagnosis not present

## 2018-12-12 DIAGNOSIS — W1830XA Fall on same level, unspecified, initial encounter: Secondary | ICD-10-CM | POA: Diagnosis not present

## 2018-12-12 DIAGNOSIS — Y999 Unspecified external cause status: Secondary | ICD-10-CM | POA: Insufficient documentation

## 2018-12-12 DIAGNOSIS — Z20828 Contact with and (suspected) exposure to other viral communicable diseases: Secondary | ICD-10-CM | POA: Insufficient documentation

## 2018-12-12 DIAGNOSIS — M25562 Pain in left knee: Secondary | ICD-10-CM | POA: Diagnosis not present

## 2018-12-12 DIAGNOSIS — Y92129 Unspecified place in nursing home as the place of occurrence of the external cause: Secondary | ICD-10-CM | POA: Insufficient documentation

## 2018-12-12 DIAGNOSIS — I69319 Unspecified symptoms and signs involving cognitive functions following cerebral infarction: Secondary | ICD-10-CM | POA: Insufficient documentation

## 2018-12-12 DIAGNOSIS — Y939 Activity, unspecified: Secondary | ICD-10-CM | POA: Diagnosis not present

## 2018-12-12 DIAGNOSIS — Z7982 Long term (current) use of aspirin: Secondary | ICD-10-CM | POA: Diagnosis not present

## 2018-12-12 DIAGNOSIS — Z4802 Encounter for removal of sutures: Secondary | ICD-10-CM | POA: Diagnosis not present

## 2018-12-12 DIAGNOSIS — Z86718 Personal history of other venous thrombosis and embolism: Secondary | ICD-10-CM | POA: Insufficient documentation

## 2018-12-12 DIAGNOSIS — W19XXXA Unspecified fall, initial encounter: Secondary | ICD-10-CM

## 2018-12-12 DIAGNOSIS — Z79899 Other long term (current) drug therapy: Secondary | ICD-10-CM | POA: Insufficient documentation

## 2018-12-12 DIAGNOSIS — M25559 Pain in unspecified hip: Secondary | ICD-10-CM

## 2018-12-12 LAB — COMPREHENSIVE METABOLIC PANEL
ALT: 10 U/L (ref 0–44)
AST: 15 U/L (ref 15–41)
Albumin: 3.4 g/dL — ABNORMAL LOW (ref 3.5–5.0)
Alkaline Phosphatase: 128 U/L — ABNORMAL HIGH (ref 38–126)
Anion gap: 9 (ref 5–15)
BUN: 17 mg/dL (ref 8–23)
CO2: 26 mmol/L (ref 22–32)
Calcium: 9.2 mg/dL (ref 8.9–10.3)
Chloride: 107 mmol/L (ref 98–111)
Creatinine, Ser: 0.9 mg/dL (ref 0.44–1.00)
GFR calc Af Amer: >60 mL/min (ref >60)
GFR calc non Af Amer: >60 mL/min (ref >60)
Glucose, Bld: 99 mg/dL (ref 70–99)
Potassium: 3.2 mmol/L — ABNORMAL LOW (ref 3.5–5.1)
Sodium: 142 mmol/L (ref 135–145)
Total Bilirubin: 0.7 mg/dL (ref 0.3–1.2)
Total Protein: 6.3 g/dL — ABNORMAL LOW (ref 6.5–8.1)

## 2018-12-12 LAB — CBC WITH DIFFERENTIAL/PLATELET
Abs Immature Granulocytes: 0.03 10*3/uL (ref 0.00–0.07)
Basophils Absolute: 0 10*3/uL (ref 0.0–0.1)
Basophils Relative: 0 %
Eosinophils Absolute: 0 10*3/uL (ref 0.0–0.5)
Eosinophils Relative: 0 %
HCT: 36.2 % (ref 36.0–46.0)
Hemoglobin: 12 g/dL (ref 12.0–15.0)
Immature Granulocytes: 0 %
Lymphocytes Relative: 13 %
Lymphs Abs: 1 10*3/uL (ref 0.7–4.0)
MCH: 31.9 pg (ref 26.0–34.0)
MCHC: 33.1 g/dL (ref 30.0–36.0)
MCV: 96.3 fL (ref 80.0–100.0)
Monocytes Absolute: 0.5 10*3/uL (ref 0.1–1.0)
Monocytes Relative: 6 %
Neutro Abs: 6 10*3/uL (ref 1.7–7.7)
Neutrophils Relative %: 81 %
Platelets: 244 10*3/uL (ref 150–400)
RBC: 3.76 MIL/uL — ABNORMAL LOW (ref 3.87–5.11)
RDW: 13.2 % (ref 11.5–15.5)
WBC: 7.5 10*3/uL (ref 4.0–10.5)
nRBC: 0 % (ref 0.0–0.2)

## 2018-12-12 LAB — APTT: aPTT: 32 seconds (ref 24–36)

## 2018-12-12 LAB — TROPONIN I (HIGH SENSITIVITY): Troponin I (High Sensitivity): 9 ng/L (ref <18)

## 2018-12-12 LAB — PROTIME-INR
INR: 1.1 (ref 0.8–1.2)
Prothrombin Time: 13.7 seconds (ref 11.4–15.2)

## 2018-12-12 LAB — SAMPLE TO BLOOD BANK

## 2018-12-12 NOTE — ED Notes (Signed)
Patient in CT

## 2018-12-12 NOTE — ED Notes (Signed)
Called EMS. Per Verline Lema, patient will be put on the list.

## 2018-12-12 NOTE — ED Triage Notes (Signed)
Witnessed fall at [facility], injury to left hip. Pt brought to ER by EMS, got 54mcg fentanyl IV en route. VSS. CBG WNL. 20G to R AC. Pt on memory care unit

## 2018-12-12 NOTE — Discharge Instructions (Addendum)
Use Tylenol as needed for pain.  Be careful do not fall.  Use a walker if at all possible.  Follow-up with your doctor return as needed.

## 2018-12-12 NOTE — ED Provider Notes (Addendum)
[facility] Emergency Department Provider Note   ____________________________________________   First MD Initiated Contact with Patient 12/12/18 1848     (approximate)  I have reviewed the triage vital signs and the nursing notes.   HISTORY  Chief Complaint Hip Injury    HPI Gabrielle Coleman is a 78 y.o. female from memory care unit who fell about 2 weeks ago hit her head and then pulled the stitches out the same day.  She fell again today and has an obvious deformity of the right hip.  She is awake and alert.  She reports pain in the right hip 2.        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 Authorizing Provider  acetaminophen (TYLENOL) 325 MG tablet Take 650 mg by mouth every 6 (six) hours as needed.   Yes [provider]  amLODipine (NORVASC) 5 MG tablet Take 5 mg by mouth daily. 02/02/17  Yes [provider]  aspirin 81 MG chewable tablet Chew 81 mg by mouth daily.   Yes [provider]  atorvastatin (LIPITOR) 40 MG  tablet Take 1 tablet (40 mg total) by mouth daily. 11/17/15  Yes Cook, Jayce G, DO  cephALEXin (KEFLEX) 500 MG capsule Take 500 mg by mouth 3 (three) times daily.   Yes [provider]  cholecalciferol (VITAMIN D) 25 MCG (1000 UT) tablet Take 1,000 Units by mouth daily.   Yes [provider]  ciprofloxacin-dexamethasone (CIPRODEX) OTIC suspension Place 10 drops into the right ear daily. For 7 days   Yes [provider]  donepezil (ARICEPT) 10 MG tablet Take 10 mg by mouth daily.   Yes [provider]  famotidine (PEPCID) 20 MG tablet Take 20 mg by mouth 2 (two) times daily.   Yes [provider]  furosemide (LASIX) 20 MG tablet Take 20 mg by mouth.   Yes [provider]  levETIRAcetam (KEPPRA) 500 MG tablet Take 1 tablet (500 mg total) by mouth 2 (two) times daily. 05/18/18  Yes Gladstone Lighter, MD  lisinopril (PRINIVIL,ZESTRIL) 20 MG tablet Take 20 mg by mouth daily. 02/02/17  Yes [provider]  memantine (NAMENDA) 10 MG tablet Take 10 mg by mouth 2 (two) times daily.   Yes [provider]  OVER THE COUNTER MEDICATION 500 mg. Cranberry and a probiotic capsule.   Yes [provider]  senna-docusate (SENOKOT-S) 8.6-50 MG tablet Take 1 tablet by mouth every other day.   Yes [provider]  venlafaxine XR (EFFEXOR-XR) 37.5 MG 24 hr capsule Take 37.5 mg by mouth daily with breakfast. Take along with one 75 mg capsule for total 112.5 mg daily   Yes [provider]  venlafaxine XR (EFFEXOR-XR) 75 MG 24 hr capsule Take 75 mg by mouth daily with breakfast. Take along with one 37.5 mg capsule for total 112.5 mg daily   Yes [provider]  sertraline (ZOLOFT) 25 MG tablet Take 25 mg by mouth daily.    [provider]    Allergies Crestor [rosuvastatin], Humibid la [guaifenesin], Lipitor [atorvastatin], Macrolides and ketolides, and Septra [sulfamethoxazole-trimethoprim]  Family History    Problem Relation Age of Onset   Lung cancer Mother    Hypertension Mother    Heart disease Mother    Prostate cancer Father    Stroke Father    Diabetes Mellitus II Sister    Diabetes Mellitus II Brother    Heart disease Brother    Hypertension Son    Liver disease Daughter     Social History Social History   Tobacco Use   Smoking status: Never Smoker   Smokeless tobacco: Never Used  Substance Use Topics   Alcohol use: No   Drug use: No    Review of Systems  Constitutional: No fever/chills Eyes: No visual changes. ENT: No sore throat. Cardiovascular: Denies chest pain. Respiratory: Denies shortness of breath. Gastrointestinal: No abdominal pain.  No nausea, no vomiting.  No diarrhea.  No constipation. Genitourinary: Negative for dysuria. Musculoskeletal: Negative for back pain. Skin: Negative for rash. Neurological: Negative for headaches, focal weakness  ____________________________________________   PHYSICAL EXAM:  VITAL SIGNS: ED Triage Vitals  Enc Vitals Group     BP 12/12/18 1849 (!) 169/45     Pulse Rate 12/12/18 1849 67     Resp 12/12/18 1849 18     Temp 12/12/18 1849 98.6 F (37 C)     Temp Source 12/12/18 1849 Oral     SpO2 12/12/18 1849 100 %     Weight 12/12/18 1850 143 lb 4.8 oz (65 kg)     Height 12/12/18 1850  (1.626 m)     Head Circumference --      Peak Flow --      Pain Score --      Pain Loc --      Pain Edu? --      Excl. in GC? --     Constitutional: Alert and oriented. Well appearing and in no acute distress! Eyes: Conjunctivae are normal.  Head: Atraumatic except for old injury on the forehead which is beginning to heal. Nose: No congestion/rhinnorhea. Mouth/Throat: Mucous membranes are moist.  Oropharynx non-erythematous. Neck: No stridor.  Cardiovascular: Normal rate, regular rhythm. Grossly normal heart sounds.  Good peripheral circulation. Respiratory: Normal respiratory effort.  No retractions. Lungs  CTAB. Gastrointestinal: Soft and nontender. No distention. No abdominal bruits. No CVA tenderness. Musculoskeletal: Right leg is internally rotated and slightly shortened there is some tenderness in the hip on palpation. Neurologic:  Normal speech and language. No gross focal neurologic deficits are appreciated.  Skin:  Skin is warm, dry and intact except from forehead. No rash noted.   ____________________________________________   LABS (all labs ordered are listed, but only abnormal results are displayed)  Labs Reviewed  CBC WITH DIFFERENTIAL/PLATELET - Abnormal; Notable for the following components:  Result Value   RBC 3.76 (*)    All other components within normal limits  SARS CORONAVIRUS 2 (TAT 6-24 HRS)  PROTIME-INR  APTT  URINALYSIS, COMPLETE (UACMP) WITH MICROSCOPIC  COMPREHENSIVE METABOLIC PANEL  SAMPLE TO BLOOD BANK  TROPONIN I (HIGH SENSITIVITY)   ____________________________________________  EKG   ____________________________________________  RADIOLOGY  ED MD interpretation: CT of the head and neck read by radiology reviewed by me show no acute fractures or intracranial problems Chest x-ray read by radiology reviewed by me is negative for any active disease.  Hip and pelvis show what appears to be a hip fracture but radiologist does not feel that it is CT read by radiology reviewed by me does not show an acute fracture  Official radiology report(s): Dg Chest 1 View  Result Date: 12/12/2018 CLINICAL DATA:  Preop EXAM: CHEST  1 VIEW COMPARISON:  05/16/2018 FINDINGS: Lungs are well aerated and clear. Negative for heart failure or edema. No effusion. Atherosclerotic aortic arch. IMPRESSION: No active disease. Electronically Signed   By: Marlan Palau M.D.   On: 12/12/2018 19:33   Ct Head Wo Contrast  Result Date: 12/12/2018 CLINICAL DATA:  Witnessed fall, LEFT hip injury, history hypertension, stroke, dementia EXAM: CT HEAD WITHOUT CONTRAST CT CERVICAL SPINE  WITHOUT CONTRAST TECHNIQUE: Multidetector CT imaging of the head and cervical spine was performed following the standard protocol without intravenous contrast. Multiplanar CT image reconstructions of the cervical spine were also generated. COMPARISON:  CT head 12/01/2018 FINDINGS: CT HEAD FINDINGS Brain: Generalized atrophy. Normal ventricular morphology. No midline shift or mass effect. Small vessel chronic ischemic changes of deep cerebral white matter. Old LEFT frontal periventricular white matter infarct. Old RIGHT occipital infarct. No intracranial hemorrhage, mass lesion or evidence of acute infarction. No extra-axial fluid collections. Vascular: Minimal atherosclerotic calcification of internal carotid arteries at skull base. No hyperdense vessels. Skull: Intact Sinuses/Orbits: Chronic opacification of RIGHT mastoid air cells and RIGHT middle ear cavity. Remaining visualized paranasal sinuses and LEFT mastoid air cells clear Other: N/A CT CERVICAL SPINE FINDINGS Alignment: Normal Skull base and vertebrae: Osseous demineralization. Skull base intact. Scattered disc space narrowing and minimal endplate spur formation greatest at C6-C7. Multilevel facet degenerative changes. Vertebral body heights maintained without fracture or subluxation. Endplate spurs encroach upon scattered neural foramina greatest at RIGHT C5-C6, RIGHT C3-C4, LEFT C3-C4. Soft tissues and spinal canal: Prevertebral soft tissues normal thickness. 1.7 cm LEFT thyroid nodule. Disc levels:  No additional abnormalities Upper chest: Lung apices clear Other: Scattered atherosclerotic calcifications within the carotid systems. IMPRESSION: Atrophy with small vessel chronic ischemic changes of deep cerebral white matter. Old LEFT frontal white matter and RIGHT occipital infarcts. No acute intracranial abnormalities. 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 No acute cervical spine abnormalities. 1.7 cm  LEFT thyroid nodule; Recommend thyroid US assessment, unless contraindicated based on patient age, condition or comorbidities.(Ref: J Am Coll Radiol. 2015 Feb;12(2): 143-50). Electronically Signed   By: Ulyses Southward M.D.   On: 12/12/2018 19:21   Ct Cervical Spine Wo Contrast  Result Date: 12/12/2018 CLINICAL DATA:  Witnessed fall, LEFT hip injury, history hypertension, stroke, dementia EXAM: CT HEAD WITHOUT CONTRAST CT CERVICAL SPINE WITHOUT CONTRAST TECHNIQUE: Multidetector CT imaging of the head and cervical spine was performed following the standard protocol without intravenous contrast. Multiplanar CT image reconstructions of the cervical spine were also generated. COMPARISON:  CT head 12/01/2018 FINDINGS: CT HEAD FINDINGS Brain: Generalized atrophy. Normal ventricular morphology. No midline shift or  mass effect. Small vessel chronic ischemic changes of deep cerebral white matter. Old LEFT frontal periventricular white matter infarct. Old RIGHT occipital infarct. No intracranial hemorrhage, mass lesion or evidence of acute infarction. No extra-axial fluid collections. Vascular: Minimal atherosclerotic calcification of internal carotid arteries at skull base. No hyperdense vessels. Skull: Intact Sinuses/Orbits: Chronic opacification of RIGHT mastoid air cells and RIGHT middle ear cavity. Remaining visualized paranasal sinuses and LEFT mastoid air cells clear Other: N/A CT CERVICAL SPINE FINDINGS Alignment: Normal Skull base and vertebrae: Osseous demineralization. Skull base intact. Scattered disc space narrowing and minimal endplate spur formation greatest at C6-C7. Multilevel facet degenerative changes. Vertebral body heights maintained without fracture or subluxation. Endplate spurs encroach upon scattered neural foramina greatest at RIGHT C5-C6, RIGHT C3-C4, LEFT C3-C4. Soft tissues and spinal canal: Prevertebral soft tissues normal thickness. 1.7 cm LEFT thyroid nodule. Disc levels:  No additional  abnormalities Upper chest: Lung apices clear Other: Scattered atherosclerotic calcifications within the carotid systems. IMPRESSION: Atrophy with small vessel chronic ischemic changes of deep cerebral white matter. Old LEFT frontal white matter and RIGHT occipital infarcts. No acute intracranial abnormalities. 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 No acute cervical spine abnormalities. 1.7 cm LEFT thyroid nodule; Recommend thyroid US assessment, unless contraindicated based on patient age, condition or comorbidities.(Ref: J Am Coll Radiol. 2015 Feb;12(2): 143-50). Electronically Signed   By: Ulyses SouthwardMark  Boles M.D.   On: 12/12/2018 19:21   Ct Hip Left Wo Contrast  Result Date: 12/12/2018 CLINICAL DATA:  Larey SeatFell.  Left hip pain. EXAM: CT OF THE LEFT HIP WITHOUT CONTRAST TECHNIQUE: Multidetector CT imaging of the left hip was performed according to the standard protocol. Multiplanar CT image reconstructions were also generated. COMPARISON:  Radiographs, same date. FINDINGS: The left hip is normally located but there is severe degenerative change with full-thickness cartilage loss, bone-on-bone appearance with bony eburnation and subchondral cystic change and florid osteophytosis. No acute hip fractures identified. No evidence of AVN. There are remote healed left superior and inferior pubic rami fractures. I do not see any definite acute fractures. Chondrocalcinosis is noted at the pubic symphysis which can be seen with CPPD arthropathy. No significant intrapelvic abnormalities are identified. Severe sigmoid colon diverticulosis is noted. IMPRESSION: 1. Severe degenerative changes involving the left hip with marked hypertrophic changes. No definite hip fracture. 2. Remote healed left superior and inferior pubic rami fractures. No definite acute fractures. Electronically Signed   By: Rudie MeyerP.  Gallerani M.D.   On: 12/12/2018 20:49   Dg Hip Unilat W Or Wo Pelvis 1 View  Right  Result Date: 12/12/2018 CLINICAL DATA:  Pain status post fall EXAM: DG HIP (WITH OR WITHOUT PELVIS) 1V RIGHT COMPARISON:  None. FINDINGS: Patient is status post total hip arthroplasty on the right. The hardware appears grossly intact. There is no dislocation. No periprosthetic fracture. There are end-stage degenerative changes of the left femoroacetabular joint. There is diffuse osteopenia which limits detection of nondisplaced fractures. There may be old healed fractures of the left inferior and superior pubic rami. IMPRESSION: 1. No acute osseous abnormality. 2. End-stage degenerative changes of the left hip. Electronically Signed   By: Katherine Mantlehristopher  Green M.D.   On: 12/12/2018 19:34   Dg Hip Unilat W Or Wo Pelvis 2-3 Views Left  Result Date: 12/12/2018 CLINICAL DATA:  Larey SeatFell. EXAM: DG HIP (WITH OR WITHOUT PELVIS) 2-3V LEFT COMPARISON:  None. 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 No obvious fracture. Acute versus  chronic fractures of the superior and inferior pubic rami. Right hip prosthesis is noted.  The IMPRESSION: 1. Severely dysplastic appearance of the left hip and severe degenerative changes. No obvious/definite hip fracture. 2. Superior and inferior pubic rami fractures, acute versus chronic. Electronically Signed   By: Rudie Meyer M.D.   On: 12/12/2018 19:39    ____________________________________________   PROCEDURES  Procedure(s) performed (including Critical Care):  Procedures   ____________________________________________   INITIAL IMPRESSION / ASSESSMENT AND PLAN / ED COURSE  Gabrielle Coleman was evaluated in Emergency Department on 12/12/2018 for the symptoms described in the history of present illness. She was evaluated in the context of the global COVID-19 pandemic, which necessitated consideration that the patient might be at risk for infection with the SARS-CoV-2 virus that causes COVID-19. Institutional protocols and  algorithms that pertain to the evaluation of patients at risk for COVID-19 are in a state of rapid change based on information released by regulatory bodies including the CDC and federal and state organizations. These policies and algorithms were followed during the patient's care in the ED.    X-rays reviewed with Dr. Ernest Pine, orthopedics.  He does not think that there is a fracture of the but we will CT just to make sure.  He is not completely sure.   ----------------------------------------- 9:15 PM on 12/12/2018 -----------------------------------------  CT comes back showing no fracture that is acute in either the hip or the pelvis.  Patient is able to move her hip slightly although she says it is uncomfortable.  Dr. Ernest Pine felt that this would probably be due to the DJD.  It is very severe. ----------------------------------------- 9:52 PM on 12/12/2018 -----------------------------------------  Stitches removed from patient's forehead wound is healed.  The open part of the wound does not look infected there is a little bit of surrounding erythema probably from healing.  Patient is able to walk well without any problems as long she uses a walker.  I will x-ray her knee very quickly and I will let her go.  Her knee is bruised and there is an abrasion there.     ____________________________________________   FINAL CLINICAL IMPRESSION(S) / ED DIAGNOSES  Final diagnoses:  Fall, initial encounter  Hip pain     ED Discharge Orders    None       Note:  This document was prepared using Dragon voice recognition software and may include unintentional dictation errors.    Arnaldo Natal, MD 12/12/18 2116    Arnaldo Natal, MD 12/12/18 [phone]

## 2018-12-12 NOTE — ED Notes (Signed)
Patient attempting to remove IV;

## 2018-12-12 NOTE — ED Notes (Signed)
Patient continues to attempt to get out of bed

## 2018-12-12 NOTE — ED Notes (Signed)
[facility] updated on discharge

## 2018-12-12 NOTE — ED Notes (Signed)
Patient discharged to [place], alert and NAD at time of dc

## 2018-12-12 NOTE — ED Notes (Signed)
Patient able to ambulate with walker, baseline. Sent for knee xray because of complaint of knee bruising

## 2018-12-13 LAB — SARS CORONAVIRUS 2 (TAT 6-24 HRS): SARS Coronavirus 2: NEGATIVE

## 2018-12-20 ENCOUNTER — Other Ambulatory Visit: Payer: Self-pay

## 2018-12-20 ENCOUNTER — Emergency Department
Admission: EM | Admit: 2018-12-20 | Discharge: 2018-12-20 | Disposition: A | Discharge status: Skilled Nursing Facility | Payer: Medicare Other | Attending: Emergency Medicine | Admitting: Emergency Medicine

## 2018-12-20 DIAGNOSIS — F0390 Unspecified dementia without behavioral disturbance: Secondary | ICD-10-CM | POA: Insufficient documentation

## 2018-12-20 DIAGNOSIS — Z7982 Long term (current) use of aspirin: Secondary | ICD-10-CM | POA: Insufficient documentation

## 2018-12-20 DIAGNOSIS — Z79899 Other long term (current) drug therapy: Secondary | ICD-10-CM | POA: Diagnosis not present

## 2018-12-20 DIAGNOSIS — L03116 Cellulitis of left lower limb: Secondary | ICD-10-CM | POA: Diagnosis not present

## 2018-12-20 DIAGNOSIS — W19XXXD Unspecified fall, subsequent encounter: Secondary | ICD-10-CM | POA: Diagnosis not present

## 2018-12-20 DIAGNOSIS — S80212D Abrasion, left knee, subsequent encounter: Secondary | ICD-10-CM | POA: Diagnosis present

## 2018-12-20 LAB — CBC
HCT: 37.9 % (ref 36.0–46.0)
Hemoglobin: 12.4 g/dL (ref 12.0–15.0)
MCH: 31.6 pg (ref 26.0–34.0)
MCHC: 32.7 g/dL (ref 30.0–36.0)
MCV: 96.7 fL (ref 80.0–100.0)
Platelets: 287 10*3/uL (ref 150–400)
RBC: 3.92 MIL/uL (ref 3.87–5.11)
RDW: 13.4 % (ref 11.5–15.5)
WBC: 7.4 10*3/uL (ref 4.0–10.5)
nRBC: 0 % (ref 0.0–0.2)

## 2018-12-20 LAB — COMPREHENSIVE METABOLIC PANEL
ALT: 13 U/L (ref 0–44)
AST: 24 U/L (ref 15–41)
Albumin: 3.2 g/dL — ABNORMAL LOW (ref 3.5–5.0)
Alkaline Phosphatase: 156 U/L — ABNORMAL HIGH (ref 38–126)
Anion gap: 9 (ref 5–15)
BUN: 15 mg/dL (ref 8–23)
CO2: 25 mmol/L (ref 22–32)
Calcium: 9.5 mg/dL (ref 8.9–10.3)
Chloride: 107 mmol/L (ref 98–111)
Creatinine, Ser: 0.9 mg/dL (ref 0.44–1.00)
GFR calc Af Amer: >60 mL/min (ref >60)
GFR calc non Af Amer: >60 mL/min (ref >60)
Glucose, Bld: 101 mg/dL — ABNORMAL HIGH (ref 70–99)
Potassium: 3.7 mmol/L (ref 3.5–5.1)
Sodium: 141 mmol/L (ref 135–145)
Total Bilirubin: 1.4 mg/dL — ABNORMAL HIGH (ref 0.3–1.2)
Total Protein: 6.3 g/dL — ABNORMAL LOW (ref 6.5–8.1)

## 2018-12-20 MED ORDER — CEFAZOLIN SODIUM-DEXTROSE 1-4 GM/50ML-% IV SOLN
1.0000 g | Freq: Once | INTRAVENOUS | Status: AC
  Administered 2018-12-20: 1 g via INTRAVENOUS
  Filled 2018-12-20: qty 50

## 2018-12-20 MED ORDER — CEPHALEXIN 500 MG PO CAPS: 500.0000 mg | ORAL_CAPSULE | Freq: Two times a day (BID) | ORAL | 0 refills | Status: AC

## 2018-12-20 NOTE — ED Notes (Signed)
Patient unable to sign due to dementia  

## 2018-12-20 NOTE — ED Notes (Signed)
Pt has a yelllow DNR form

## 2018-12-20 NOTE — ED Notes (Signed)
One set of blood cultures, grey, blue, light green and lavender tubes sent to lab

## 2018-12-20 NOTE — ED Notes (Addendum)
Report given to [facility] at [facility]. Gabrielle Coleman reports pt has a rash under her chin and wanted this looked at. PT has redness under chin area where mask is and has an area of redness to upper left arm, Dr. Jari Pigg informed.

## 2018-12-20 NOTE — ED Notes (Addendum)
Legal guardian and granddaughter Elmyra Ricks informed pt is in the ER. Pt's status and discharge discussed with Elmyra Ricks and informed her of pending discharge back to [facility]

## 2018-12-20 NOTE — ED Provider Notes (Signed)
[facility] Emergency Department Provider Note   ____________________________________________    I have reviewed the triage vital signs and the nursing notes.   HISTORY  Chief Complaint Fall   History limited by dementia   HPI Gabrielle Coleman is a 78 y.o. female who presents for evaluation.  Reportedly had a fall 2 weeks ago, suffered laceration to the head which does not appear to have been sutured or repaired, also suffered abrasion to the left knee.  Now with redness surrounding abrasion and traveling distally and somewhat proximally.  No significant tenderness however suspicious for cellulitis.  No fevers reported.  Past Medical History:  Diagnosis Date  . Dementia (HCC)   . History of pulmonary embolus (PE)   . HTN (hypertension)   . Hyperlipidemia   . 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 Osteoarthritis   . Osteopenia   . Vitamin D deficiency     Patient Active Problem List   Diagnosis Date Noted  . Seizures (HCC) 05/16/2018  . Urinary incontinence 11/19/2015  . Vitamin D deficiency   . Osteopenia   . Osteoarthritis   . 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 Hyperlipidemia   . Essential hypertension   . History of pulmonary embolus (PE)   . History of DVT (deep vein thrombosis)   . Dementia [facility])     Past Surgical History:  Procedure Laterality Date  . APPENDECTOMY    . DILATION AND CURETTAGE OF UTERUS    . FEMORAL HERNIA REPAIR Right   . LUMBAR FUSION     L4-L5  . TOTAL HIP ARTHROPLASTY Right   . TUBAL LIGATION      Prior to Admission medications   Medication Sig Start Date End Date Taking? Authorizing Provider  acetaminophen (TYLENOL) 325 MG tablet Take 650 mg by mouth every 6 (six) hours as needed.   Yes [provider]  amLODipine (NORVASC) 5 MG tablet Take 5 mg by mouth daily. 02/02/17  Yes [provider]  aspirin 81 MG chewable tablet Chew 81 mg by mouth  daily.   Yes [provider]  atorvastatin (LIPITOR) 40 MG tablet Take 1 tablet (40 mg total) by mouth daily. 11/17/15  Yes Cook, Jayce G, DO  Cholecalciferol (VITAMIN D3) 125 MCG (5000 UT) TABS Take 5,000 Units by mouth daily.    Yes [provider]  Cranberry 1000 MG CAPS Take 1,000 mg by mouth daily.   Yes [provider]  donepezil (ARICEPT) 10 MG tablet Take 10 mg by mouth at bedtime.    Yes [provider]  famotidine (PEPCID) 20 MG tablet Take 20 mg by mouth daily.    Yes [provider]  furosemide (LASIX) 20 MG tablet Take 20 mg by mouth.   Yes [provider]  levETIRAcetam (KEPPRA) 500 MG tablet Take 1 tablet (500 mg total) by mouth 2 (two) times daily. 05/18/18  Yes Enid Baas, MD  lisinopril (PRINIVIL,ZESTRIL) 20 MG tablet Take 20 mg by mouth daily. 02/02/17  Yes [provider]  memantine (NAMENDA) 10 MG tablet Take 10 mg by mouth 2 (two) times daily.   Yes [provider]  senna-docusate (SENOKOT-S) 8.6-50 MG tablet Take 1 tablet by mouth every other day.   Yes [provider]  venlafaxine XR (EFFEXOR-XR) 37.5 MG 24 hr capsule Take 37.5 mg by mouth daily with breakfast. Take along with one 75 mg capsule for total 112.5 mg daily  Yes [provider]  venlafaxine XR (EFFEXOR-XR) 75 MG 24 hr capsule Take 75 mg by mouth daily with breakfast. Take along with one 37.5 mg capsule for total 112.5 mg daily   Yes [provider]     Allergies Crestor [rosuvastatin], Humibid la [guaifenesin], Lipitor [atorvastatin], Macrolides and ketolides, and Septra [sulfamethoxazole-trimethoprim]  Family History  Problem Relation Age of Onset  . Lung cancer Mother   . Hypertension Mother   . Heart disease Mother   . Prostate cancer Father   . Stroke Father   . Diabetes Mellitus II Sister   . Diabetes Mellitus II Brother   . Heart disease Brother   . Hypertension Son   . Liver disease Daughter      Social History Social History   Tobacco Use  . Smoking status: Never Smoker  . Smokeless tobacco: Never Used  Substance Use Topics  . Alcohol use: No  . Drug use: No    Review of Systems limited by dementia  Constitutional: No fever/chills reported     Musculoskeletal: As above Skin: As above e  ____________________________________________   PHYSICAL EXAM:  VITAL SIGNS: ED Triage Vitals  Enc Vitals Group     BP 12/20/18 1142 (!) 152/77     Pulse Rate 12/20/18 1142 74     Resp 12/20/18 1142 18     Temp 12/20/18 1142 98.4 F (36.9 C)     Temp Source 12/20/18 1142 Oral     SpO2 12/20/18 1142 99 %     Weight 12/20/18 1143 72.6 kg (160 lb)     Height 12/20/18 1143 1.626 m (5\' 4" )     Head Circumference --      Peak Flow --      Pain Score --      Pain Loc --      Pain Edu? --      Excl. in GC? --     Constitutional: Alert Eyes: Conjunctivae are normal.  Head: Well-healing laceration to the left forehead Nose: No congestion/rhinnorhea.  Cardiovascular: Normal rate, regular rhythm. Grossly normal heart sounds.  Good peripheral circulation. Respiratory: Normal respiratory effort.  No retractions. Lungs CTAB. Gastrointestinal: Soft and nontender. No distention.  No CVA tenderness.  Musculoskeletal: Erythema extending from left knee distally and somewhat proximally, minimal tenderness Neurologic:  Normal speech and language. No gross focal neurologic deficits are appreciated.  Skin:  Skin is warm, dry, see above Psychiatric: Mood and affect are normal. Speech and behavior are normal.  ____________________________________________   LABS (all labs ordered are listed, but only abnormal results are displayed)  Labs Reviewed  COMPREHENSIVE METABOLIC PANEL - Abnormal; Notable for the following components:      Result Value   Glucose, Bld 101 (*)    Total Protein 6.3 (*)    Albumin 3.2 (*)    Alkaline Phosphatase 156 (*)    Total Bilirubin 1.4 (*)     All other components within normal limits  CBC   ____________________________________________  EKG   ____________________________________________  RADIOLOGY   ____________________________________________   PROCEDURES  Procedure(s) performed: No  Procedures   Critical Care performed: No ____________________________________________   INITIAL IMPRESSION / ASSESSMENT AND PLAN / ED COURSE  Pertinent labs & imaging results that were available during my care of the patient were reviewed by me and considered in my medical decision making (see chart for details).  Patient presents with likely cellulitis stemming from abrasion to the left knee, lab work is reassuring, afebrile.  Will give dose of Ancef here in the emergency department anticipate discharge with p.o. antibiotics.    ____________________________________________   FINAL CLINICAL IMPRESSION(S) / ED DIAGNOSES  Final diagnoses:  Cellulitis of left lower extremity        Note:  This document was prepared using Dragon voice recognition software and may include unintentional dictation errors.   Lavonia Drafts, MD 12/20/18 [phone]

## 2018-12-20 NOTE — ED Notes (Signed)
Per lab lavender tube clotted, resent to lab

## 2018-12-20 NOTE — ED Triage Notes (Signed)
Pt arrives via [facility] from [place] for an unwitnessed fall 2 weeks ago. PT has a laceration to left side of forehead as well as a closed wound to left knee, no bleeding observed. Left knee is red surrounding the wound and pt grimaces when left ankle is touched. PT alert to self only, hx dementia, per facility this is baseline for pt.

## 2019-02-13 ENCOUNTER — Emergency Department: Payer: Medicare Other

## 2019-02-13 ENCOUNTER — Other Ambulatory Visit: Payer: Self-pay

## 2019-02-13 ENCOUNTER — Emergency Department
Admission: EM | Admit: 2019-02-13 | Discharge: 2019-02-13 | Disposition: A | Discharge status: Home / Self Care | Payer: Medicare Other | Attending: Emergency Medicine | Admitting: Emergency Medicine

## 2019-02-13 ENCOUNTER — Encounter: Payer: Self-pay | Admitting: Emergency Medicine

## 2019-02-13 DIAGNOSIS — Z96641 Presence of right artificial hip joint: Secondary | ICD-10-CM | POA: Insufficient documentation

## 2019-02-13 DIAGNOSIS — Z7982 Long term (current) use of aspirin: Secondary | ICD-10-CM | POA: Insufficient documentation

## 2019-02-13 DIAGNOSIS — W010XXA Fall on same level from slipping, tripping and stumbling without subsequent striking against object, initial encounter: Secondary | ICD-10-CM | POA: Diagnosis not present

## 2019-02-13 DIAGNOSIS — Z79899 Other long term (current) drug therapy: Secondary | ICD-10-CM | POA: Diagnosis not present

## 2019-02-13 DIAGNOSIS — W19XXXA Unspecified fall, initial encounter: Secondary | ICD-10-CM

## 2019-02-13 DIAGNOSIS — F0390 Unspecified dementia without behavioral disturbance: Secondary | ICD-10-CM | POA: Insufficient documentation

## 2019-02-13 DIAGNOSIS — I10 Essential (primary) hypertension: Secondary | ICD-10-CM | POA: Insufficient documentation

## 2019-02-13 LAB — URINALYSIS, COMPLETE (UACMP) WITH MICROSCOPIC
Bacteria, UA: NONE SEEN
Bilirubin Urine: NEGATIVE
Glucose, UA: NEGATIVE mg/dL
Hgb urine dipstick: NEGATIVE
Ketones, ur: NEGATIVE mg/dL
Leukocytes,Ua: NEGATIVE
Nitrite: NEGATIVE
Protein, ur: NEGATIVE mg/dL
Specific Gravity, Urine: 1.019 (ref 1.005–1.030)
pH: 6 (ref 5.0–8.0)

## 2019-02-13 LAB — CBC WITH DIFFERENTIAL/PLATELET
Abs Immature Granulocytes: 0.02 10*3/uL (ref 0.00–0.07)
Basophils Absolute: 0 10*3/uL (ref 0.0–0.1)
Basophils Relative: 1 %
Eosinophils Absolute: 0 10*3/uL (ref 0.0–0.5)
Eosinophils Relative: 1 %
HCT: 42.3 % (ref 36.0–46.0)
Hemoglobin: 14.1 g/dL (ref 12.0–15.0)
Immature Granulocytes: 1 %
Lymphocytes Relative: 11 %
Lymphs Abs: 0.4 10*3/uL — ABNORMAL LOW (ref 0.7–4.0)
MCH: 31.6 pg (ref 26.0–34.0)
MCHC: 33.3 g/dL (ref 30.0–36.0)
MCV: 94.8 fL (ref 80.0–100.0)
Monocytes Absolute: 0.3 10*3/uL (ref 0.1–1.0)
Monocytes Relative: 8 %
Neutro Abs: 3.2 10*3/uL (ref 1.7–7.7)
Neutrophils Relative %: 78 %
Platelets: 188 10*3/uL (ref 150–400)
RBC: 4.46 MIL/uL (ref 3.87–5.11)
RDW: 13.1 % (ref 11.5–15.5)
WBC: 4 10*3/uL (ref 4.0–10.5)
nRBC: 0 % (ref 0.0–0.2)

## 2019-02-13 LAB — COMPREHENSIVE METABOLIC PANEL
ALT: 11 U/L (ref 0–44)
AST: 14 U/L — ABNORMAL LOW (ref 15–41)
Albumin: 3.5 g/dL (ref 3.5–5.0)
Alkaline Phosphatase: 144 U/L — ABNORMAL HIGH (ref 38–126)
Anion gap: 10 (ref 5–15)
BUN: 10 mg/dL (ref 8–23)
CO2: 26 mmol/L (ref 22–32)
Calcium: 9.3 mg/dL (ref 8.9–10.3)
Chloride: 104 mmol/L (ref 98–111)
Creatinine, Ser: 0.81 mg/dL (ref 0.44–1.00)
GFR calc Af Amer: >60 mL/min (ref >60)
GFR calc non Af Amer: >60 mL/min (ref >60)
Glucose, Bld: 94 mg/dL (ref 70–99)
Potassium: 3.4 mmol/L — ABNORMAL LOW (ref 3.5–5.1)
Sodium: 140 mmol/L (ref 135–145)
Total Bilirubin: 0.7 mg/dL (ref 0.3–1.2)
Total Protein: 6.3 g/dL — ABNORMAL LOW (ref 6.5–8.1)

## 2019-02-13 LAB — TROPONIN I (HIGH SENSITIVITY): Troponin I (High Sensitivity): 11 ng/L (ref <18)

## 2019-02-13 LAB — CK: Total CK: 50 U/L (ref 38–234)

## 2019-02-13 NOTE — Discharge Instructions (Addendum)
Gabrielle Coleman has a normal exam following reports of being found down. Her labs and imaging are negative for any acute findings. Follow-up with [facility] as needed.

## 2019-02-13 NOTE — ED Triage Notes (Signed)
Pt in via [facility] from [facility].  Pt with unwitnessed fall, found sitting in floor with left side against night stand.  Pt unable to recall fall, alert to self only.  Per EMS, pt presents at baseline mental status.    NAD noted at this time.

## 2019-02-13 NOTE — ED Provider Notes (Signed)
[facility] Emergency Department Provider Note ____________________________________________  Time seen: 1629  I have reviewed the triage vital signs and the nursing notes.  HISTORY  Chief Complaint  Fall  History provided by EMS.  Patient is with dementia at baseline.  HPI Gabrielle Coleman is a 79 y.o. female geriatric patient presents to the ED via EMS from her memory care center, [facility].  Patient apparently sustained an unwitnessed fall, and was found sitting in the floor with her left side of the face against a nightstand.  Patient is unable to recall the fall and is alert only to herself.  She denies any injuries verbally.  According to EMS, the patient is apparently at her mental status baseline.  No obvious injuries are relayed by EMS.  Past Medical History:  Diagnosis Date  . Dementia (HCC)   . History of pulmonary embolus (PE)   . HTN (hypertension)   . Hyperlipidemia   . 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 Osteoarthritis   . Osteopenia   . Vitamin D deficiency     Patient Active Problem List   Diagnosis Date Noted  . Seizures (HCC) 05/16/2018  . Urinary incontinence 11/19/2015  . Vitamin D deficiency   . Osteopenia   . Osteoarthritis   . 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 Hyperlipidemia   . Essential hypertension   . History of pulmonary embolus (PE)   . History of DVT (deep vein thrombosis)   . Dementia [facility])     Past Surgical History:  Procedure Laterality Date  . APPENDECTOMY    . DILATION AND CURETTAGE OF UTERUS    . FEMORAL HERNIA REPAIR Right   . LUMBAR FUSION     L4-L5  . TOTAL HIP ARTHROPLASTY Right   . TUBAL LIGATION      Prior to Admission medications   Medication Sig Start Date End Date Taking? Authorizing Provider  acetaminophen (TYLENOL) 325 MG tablet Take 650 mg by mouth every 6 (six) hours as needed.    [provider]  amLODipine (NORVASC) 5  MG tablet Take 5 mg by mouth daily. 02/02/17   [provider]  aspirin 81 MG chewable tablet Chew 81 mg by mouth daily.    [provider]  atorvastatin (LIPITOR) 40 MG tablet Take 1 tablet (40 mg total) by mouth daily. 11/17/15   Tommie Sams, DO  cephALEXin (KEFLEX) 500 MG capsule Take 1 capsule (500 mg total) by mouth 2 (two) times daily. 12/20/18   Jene Every, MD  Cholecalciferol (VITAMIN D3) 125 MCG (5000 UT) TABS Take 5,000 Units by mouth daily.     [provider]  Cranberry 1000 MG CAPS Take 1,000 mg by mouth daily.    [provider]  donepezil (ARICEPT) 10 MG tablet Take 10 mg by mouth at bedtime.     [provider]  famotidine (PEPCID) 20 MG tablet Take 20 mg by mouth daily.     [provider]  furosemide (LASIX) 20 MG tablet Take 20 mg by mouth.    [provider]  levETIRAcetam (KEPPRA) 500 MG tablet Take 1 tablet (500 mg total) by mouth 2 (two) times daily. 05/18/18   Enid Baas, MD  lisinopril (PRINIVIL,ZESTRIL) 20 MG tablet Take 20 mg by mouth daily. 02/02/17   [provider]  memantine (NAMENDA) 10 MG tablet Take 10 mg by mouth 2 (two) times daily.    [provider]  senna-docusate (SENOKOT-S) 8.6-50 MG tablet Take 1 tablet by mouth every other day.    [provider]  venlafaxine XR (EFFEXOR-XR) 37.5 MG 24 hr capsule Take 37.5 mg by mouth daily with breakfast. Take along with one 75 mg capsule for total 112.5 mg daily    [provider]  venlafaxine XR (EFFEXOR-XR) 75 MG 24 hr capsule Take 75 mg by mouth daily with breakfast. Take along with one 37.5 mg capsule for total 112.5 mg daily    [provider]    Allergies Crestor [rosuvastatin], Humibid la [guaifenesin], Lipitor [atorvastatin], Macrolides and ketolides, and Septra [sulfamethoxazole-trimethoprim]  Family History  Problem Relation Age of Onset  . Lung cancer Mother   . Hypertension Mother   .  Heart disease Mother   . Prostate cancer Father   . Stroke Father   . Diabetes Mellitus II Sister   . Diabetes Mellitus II Brother   . Heart disease Brother   . Hypertension Son   . Liver disease Daughter     Social History Social History   Tobacco Use  . Smoking status: Never Smoker  . Smokeless tobacco: Never Used  Substance Use Topics  . Alcohol use: No  . Drug use: No    Review of Systems  Unable to assess with patient.  ____________________________________________  PHYSICAL EXAM:  VITAL SIGNS: ED Triage Vitals  Enc Vitals Group     BP 02/13/19 1630 (!) 142/78     Pulse Rate 02/13/19 1630 85     Resp 02/13/19 1630 18     Temp 02/13/19 1630 99.2 F (37.3 C)     Temp Source 02/13/19 1630 Oral     SpO2 02/13/19 1630 98 %     Weight 02/13/19 1621 150 lb (68 kg)     Height 02/13/19 1621 5\' 4"  (1.626 m)     Head Circumference --      Peak Flow --      Pain Score --      Pain Loc --      Pain Edu? --      Excl. in GC? --     Constitutional: Alert and oriented. Well appearing and in no distress. Head: Normocephalic and atraumatic. Eyes: Conjunctivae are normal. Normal extraocular movements Neck: Supple. Normal ROM Cardiovascular: Normal rate, regular rhythm. Normal distal pulses. Respiratory: Normal respiratory effort. No wheezes/rales/rhonchi. Gastrointestinal: Soft and nontender. No distention. Musculoskeletal: Nontender with normal range of motion in all extremities.  Skin:  Skin is warm, dry and intact. No rash noted. ____________________________________________   LABS (pertinent positives/negatives)  Labs Reviewed  COMPREHENSIVE METABOLIC PANEL - Abnormal; Notable for the following components:      Result Value   Potassium 3.4 (*)    Total Protein 6.3 (*)    AST 14 (*)    Alkaline Phosphatase 144 (*)    All other components within normal limits  CBC WITH DIFFERENTIAL/PLATELET - Abnormal; Notable for the following components:   Lymphs Abs 0.4 (*)     All other components within normal limits  URINALYSIS, COMPLETE (UACMP) WITH MICROSCOPIC - Abnormal; Notable for the following components:   Color, Urine YELLOW (*)    APPearance CLEAR (*)    All other components within normal limits  CK  TROPONIN I (HIGH SENSITIVITY)  TROPONIN I (HIGH SENSITIVITY)  ____________________________________________  EKG  NSR 80 bpm PR interval 185 ms QRS duration 84 ms No STEMI ____________________________________________   RADIOLOGY  CT Head w/o CM IMPRESSION: Severe atrophy and  chronic small vessel disease.  No acute intracranial abnormality.  CT Cervical Spine w/o CM IMPRESSION: Degenerative disc disease.  No acute bony abnormality. ____________________________________________  PROCEDURES  Procedures ____________________________________________  INITIAL IMPRESSION / ASSESSMENT AND PLAN / ED COURSE  Geriatric patient with ED evaluation following an unwitnessed fall at her memory care center.  Patient was reportedly found in her room but the duration of the incident is unknown.  Patient is unable to offer any HPI or endorse any injury.  CT imaging the head and neck are negative.  Labs also benign reassuring at this time.  EKG did not show any ischemic changes.  Patient is discharged at this time to follow-up with the primary provider of her assisted living facility.  EMS will be called to provide nonemergency transport.  Rielyn Krupinski was evaluated in Emergency Department on 02/13/2019 for the symptoms described in the history of present illness. She was evaluated in the context of the global COVID-19 pandemic, which necessitated consideration that the patient might be at risk for infection with the SARS-CoV-2 virus that causes COVID-19. Institutional protocols and algorithms that pertain to the evaluation of patients at risk for COVID-19 are in a state of rapid change based on information released by regulatory bodies including the CDC and  federal and state organizations. These policies and algorithms were followed during the patient's care in the ED. ____________________________________________  FINAL CLINICAL IMPRESSION(S) / ED DIAGNOSES  Final diagnoses:  Fall in home, initial encounter      Melvenia Needles, PA-C 02/13/19 2132    Nance Pear, MD 02/13/19 2153

## 2021-02-03 DEATH — deceased

## 2022-01-25 IMAGING — CT CT HEAD W/O CM
3 series · 15 of 45 positions shown, 18 images · non-contrast
Comparison: 12/12/2018

CLINICAL DATA: Unwitnessed fall

EXAM:
CT HEAD WITHOUT CONTRAST
TECHNIQUE: Contiguous axial images were obtained from the base of the skull
through the vertex without intravenous contrast.

[Series 2: head wo · axial · 0.42mm/px · z∈[-148,-33]mm · 9 of 28 slices shown, 12 images]
[im 3/28  brain]
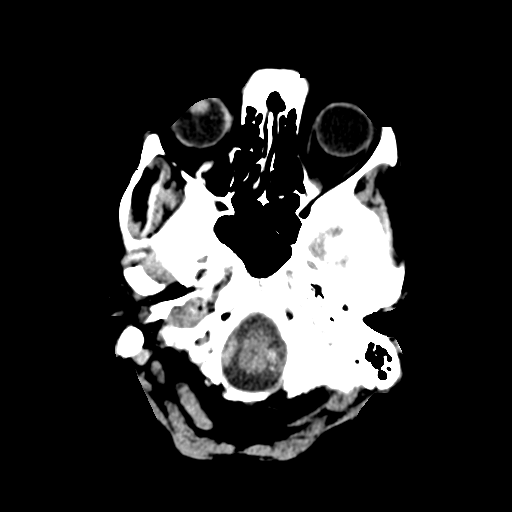
[im 3/28  bone]
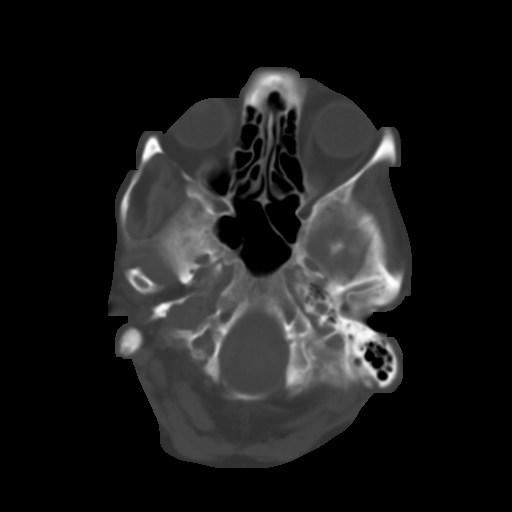
[im 6/28  brain]
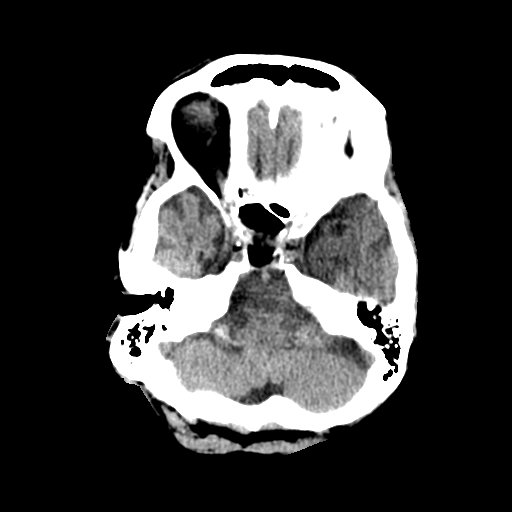
[im 9/28  brain]
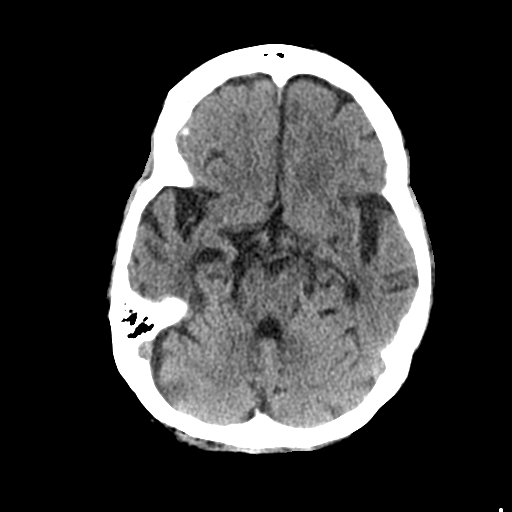
[im 12/28  brain]
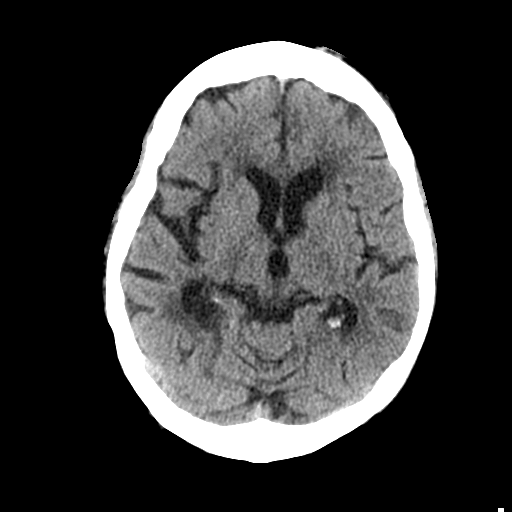
[im 15/28  brain]
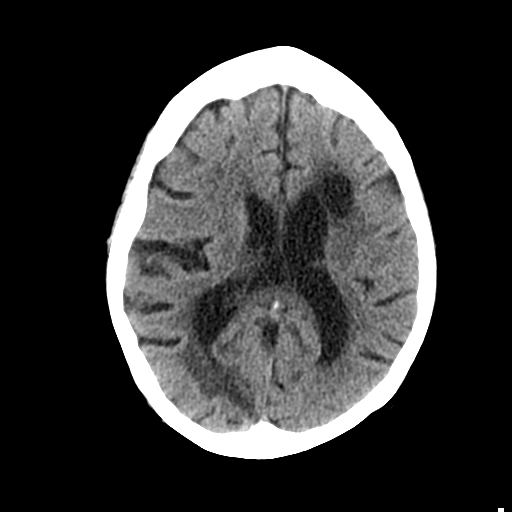
[im 15/28  bone]
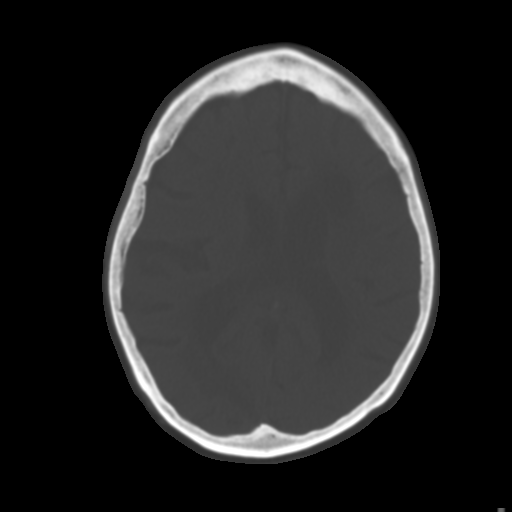
[im 17/28  brain]
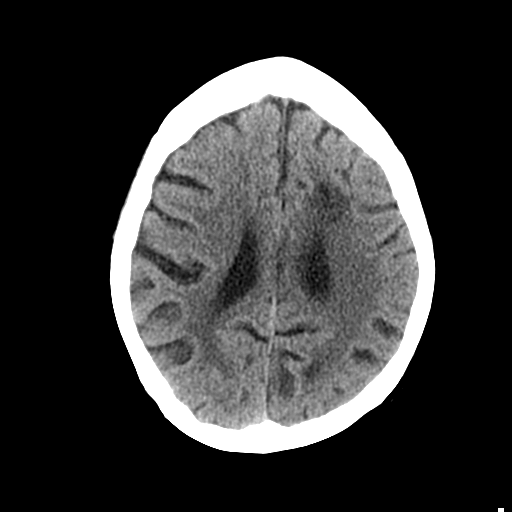
[im 20/28  brain]
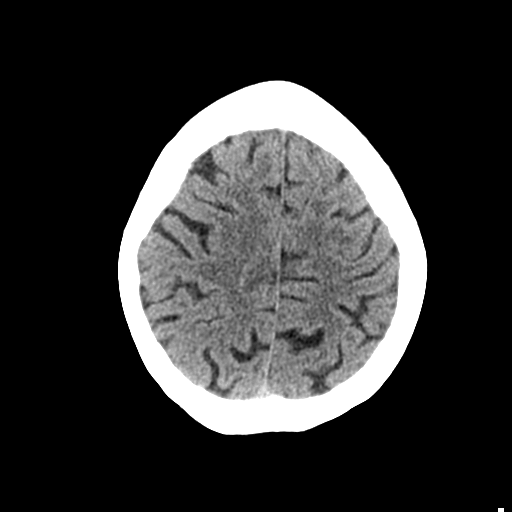
[im 23/28  brain]
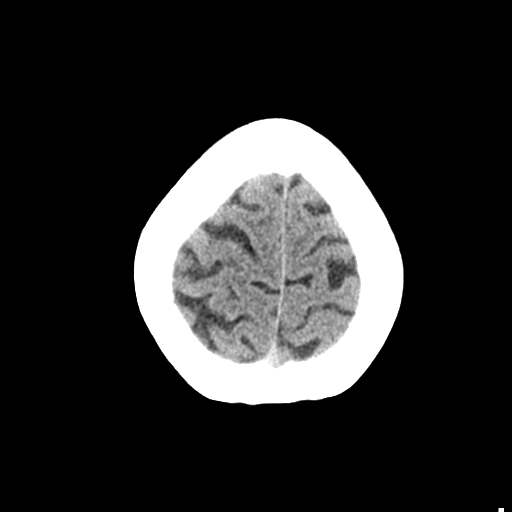
[im 26/28  brain]
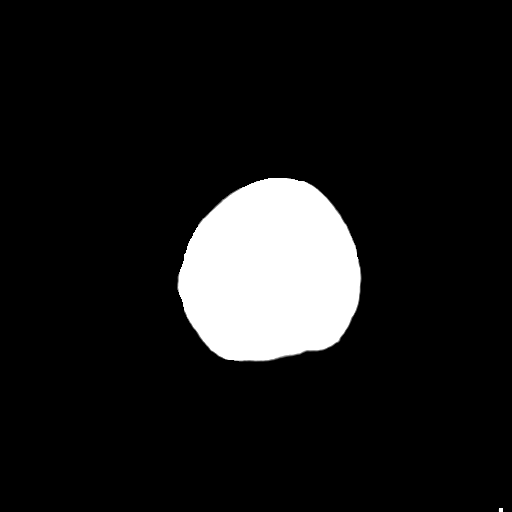
[im 26/28  bone]
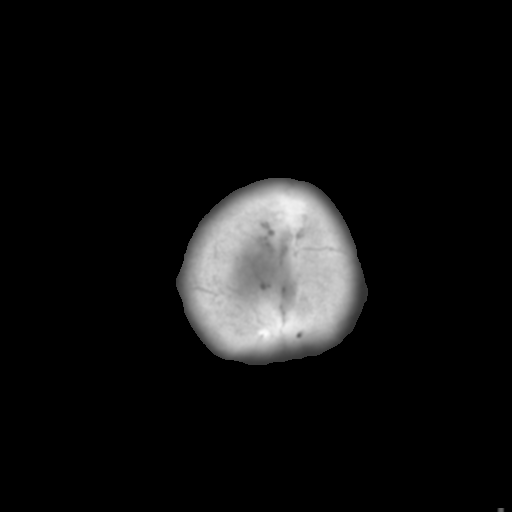

[Series 4: coronal soft tissue · coronal · 0.28mm/px · 3 of 57 slices shown]
[im 19/57  brain]
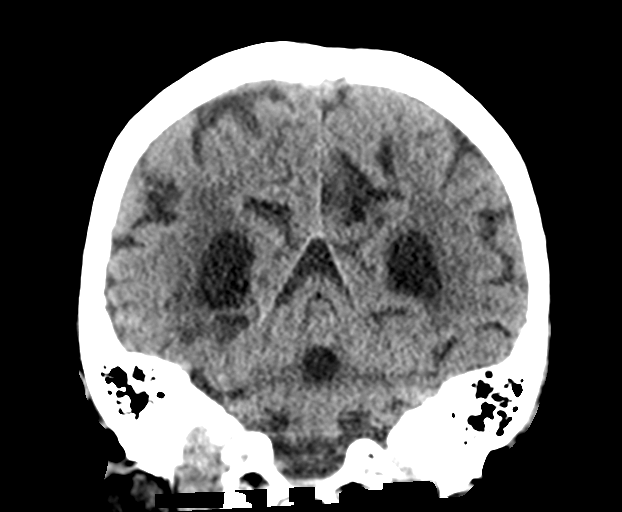
[im 25/57  brain]
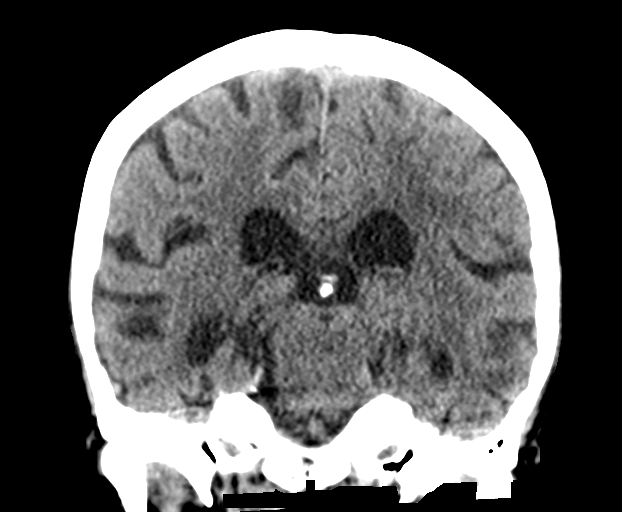
[im 32/57  brain]
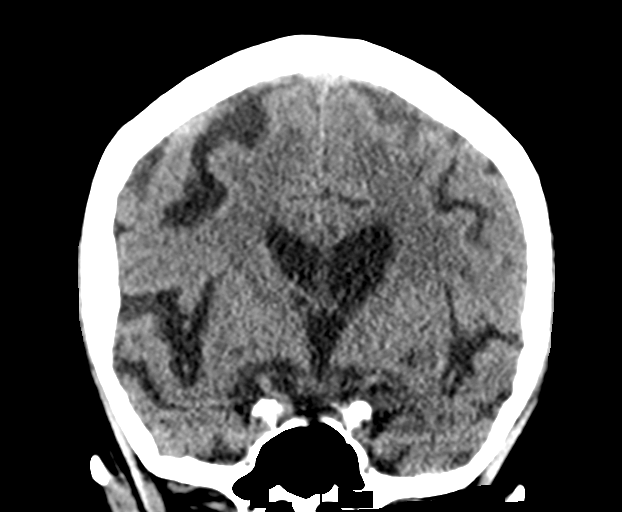

[Series 5: sagittal soft tissue · sagittal · 0.28mm/px · 3 of 48 slices shown]
[im 16/48  brain]
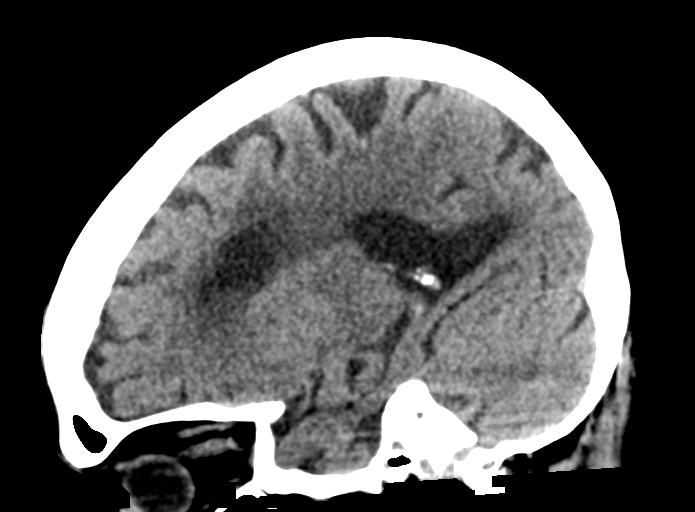
[im 24/48  brain]
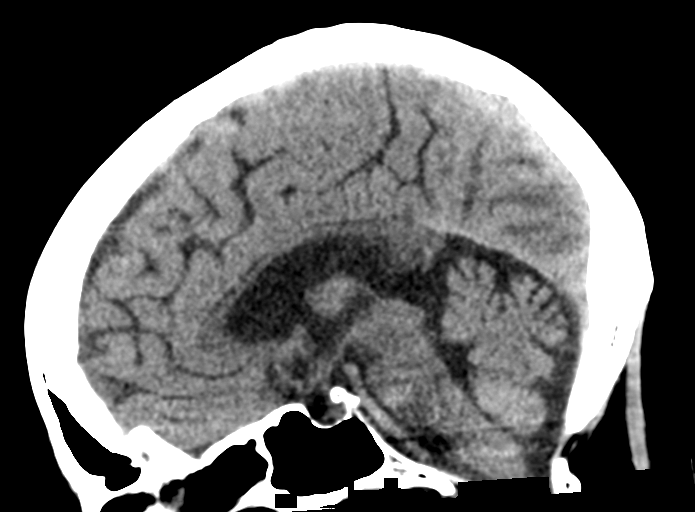
[im 32/48  brain]
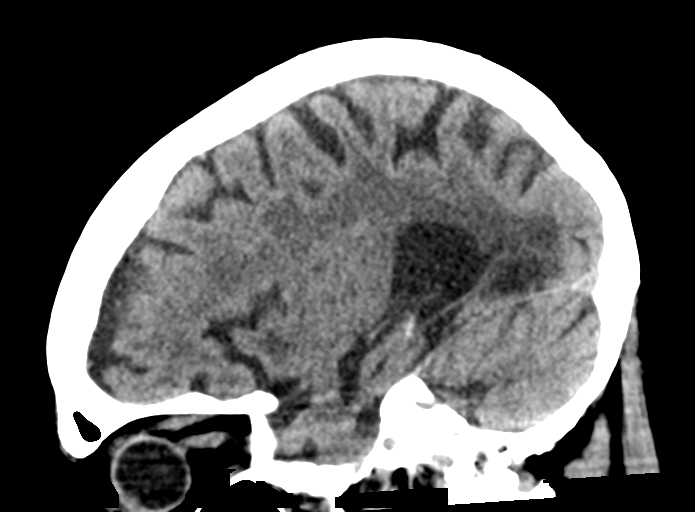

[15 of 45 positions shown; findings below may reference images not displayed]

FINDINGS: Brain: Severe generalized atrophy and chronic small vessel disease
throughout the deep white matter. Multiple old infarcts including
left frontal periventricular area and right occipital lobe. No acute
intracranial abnormality. Specifically, no hemorrhage,
hydrocephalus, mass lesion, acute infarction, or significant
intracranial injury.

Vascular: No hyperdense vessel or unexpected calcification.

Skull: No acute calvarial abnormality.

Sinuses/Orbits: Visualized paranasal sinuses and mastoids clear.
Orbital soft tissues unremarkable.

Other: None
IMPRESSION: Severe atrophy and chronic small vessel disease.

No acute intracranial abnormality.
